# Patient Record
Sex: Female | Born: 1945 | Race: White | Hispanic: No | Marital: Married | Smoking: Never smoker
Health system: Southern US, Community
[De-identification: ages and names within clinical notes are randomized; demographics above are authoritative.]

## PROBLEM LIST (undated history)

## (undated) DIAGNOSIS — T7840XA Allergy, unspecified, initial encounter: Secondary | ICD-10-CM

## (undated) DIAGNOSIS — K219 Gastro-esophageal reflux disease without esophagitis: Secondary | ICD-10-CM

## (undated) DIAGNOSIS — M810 Age-related osteoporosis without current pathological fracture: Secondary | ICD-10-CM

## (undated) HISTORY — DX: Age-related osteoporosis without current pathological fracture: M81.0

## (undated) HISTORY — DX: Gastro-esophageal reflux disease without esophagitis: K21.9

## (undated) HISTORY — PX: CARPAL TUNNEL RELEASE: SHX101

## (undated) HISTORY — DX: Allergy, unspecified, initial encounter: T78.40XA

## (undated) HISTORY — PX: SALPINGOOPHORECTOMY: SHX82

## (undated) HISTORY — PX: BREAST BIOPSY: SHX20

---
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
## 1974-08-08 HISTORY — PX: ABDOMINAL HYSTERECTOMY: SHX81

## 1983-08-09 HISTORY — PX: CHOLECYSTECTOMY: SHX55

## 2013-02-26 DIAGNOSIS — D0511 Intraductal carcinoma in situ of right breast: Secondary | ICD-10-CM

## 2013-02-26 HISTORY — DX: Intraductal carcinoma in situ of right breast: D05.11

## 2015-09-04 DIAGNOSIS — I10 Essential (primary) hypertension: Secondary | ICD-10-CM | POA: Diagnosis not present

## 2015-09-04 DIAGNOSIS — D0511 Intraductal carcinoma in situ of right breast: Secondary | ICD-10-CM | POA: Diagnosis not present

## 2015-09-04 DIAGNOSIS — M810 Age-related osteoporosis without current pathological fracture: Secondary | ICD-10-CM

## 2016-03-03 DIAGNOSIS — D0511 Intraductal carcinoma in situ of right breast: Secondary | ICD-10-CM

## 2017-01-13 DIAGNOSIS — E559 Vitamin D deficiency, unspecified: Secondary | ICD-10-CM | POA: Insufficient documentation

## 2017-01-13 DIAGNOSIS — I10 Essential (primary) hypertension: Secondary | ICD-10-CM

## 2017-01-13 DIAGNOSIS — T148XXA Other injury of unspecified body region, initial encounter: Secondary | ICD-10-CM

## 2017-01-13 DIAGNOSIS — E538 Deficiency of other specified B group vitamins: Secondary | ICD-10-CM | POA: Insufficient documentation

## 2017-01-13 HISTORY — DX: Other injury of unspecified body region, initial encounter: T14.8XXA

## 2017-01-13 HISTORY — DX: Deficiency of other specified B group vitamins: E53.8

## 2017-01-13 HISTORY — DX: Vitamin D deficiency, unspecified: E55.9

## 2017-01-13 HISTORY — DX: Essential (primary) hypertension: I10

## 2017-09-01 DIAGNOSIS — D0511 Intraductal carcinoma in situ of right breast: Secondary | ICD-10-CM

## 2017-12-19 DIAGNOSIS — M5136 Other intervertebral disc degeneration, lumbar region: Secondary | ICD-10-CM | POA: Insufficient documentation

## 2017-12-19 DIAGNOSIS — M51369 Other intervertebral disc degeneration, lumbar region without mention of lumbar back pain or lower extremity pain: Secondary | ICD-10-CM

## 2017-12-19 HISTORY — DX: Other intervertebral disc degeneration, lumbar region without mention of lumbar back pain or lower extremity pain: M51.369

## 2018-03-01 ENCOUNTER — Other Ambulatory Visit: Payer: Self-pay | Admitting: Orthopedic Surgery

## 2018-03-01 DIAGNOSIS — M4712 Other spondylosis with myelopathy, cervical region: Secondary | ICD-10-CM

## 2018-03-04 ENCOUNTER — Ambulatory Visit
Admission: RE | Admit: 2018-03-04 | Discharge: 2018-03-04 | Disposition: A | Discharge status: Home / Self Care | Payer: Medicare Other | Source: Ambulatory Visit | Attending: Orthopedic Surgery | Admitting: Orthopedic Surgery

## 2018-03-04 DIAGNOSIS — M4712 Other spondylosis with myelopathy, cervical region: Secondary | ICD-10-CM

## 2018-03-12 LAB — HM COLONOSCOPY

## 2018-06-20 DIAGNOSIS — M4802 Spinal stenosis, cervical region: Secondary | ICD-10-CM | POA: Insufficient documentation

## 2018-06-20 HISTORY — DX: Spinal stenosis, cervical region: M48.02

## 2019-02-05 ENCOUNTER — Other Ambulatory Visit: Payer: Self-pay | Admitting: Rehabilitation

## 2019-02-05 ENCOUNTER — Ambulatory Visit
Admission: RE | Admit: 2019-02-05 | Discharge: 2019-02-05 | Disposition: A | Discharge status: Home / Self Care | Payer: Medicare Other | Source: Ambulatory Visit | Attending: Rehabilitation | Admitting: Rehabilitation

## 2019-02-05 DIAGNOSIS — M47816 Spondylosis without myelopathy or radiculopathy, lumbar region: Secondary | ICD-10-CM

## 2019-03-28 DIAGNOSIS — Z0000 Encounter for general adult medical examination without abnormal findings: Secondary | ICD-10-CM | POA: Insufficient documentation

## 2019-03-28 HISTORY — DX: Encounter for general adult medical examination without abnormal findings: Z00.00

## 2019-06-17 DIAGNOSIS — Z86000 Personal history of in-situ neoplasm of breast: Secondary | ICD-10-CM | POA: Diagnosis not present

## 2019-06-17 DIAGNOSIS — M810 Age-related osteoporosis without current pathological fracture: Secondary | ICD-10-CM

## 2019-10-11 DIAGNOSIS — J3089 Other allergic rhinitis: Secondary | ICD-10-CM | POA: Insufficient documentation

## 2019-10-11 DIAGNOSIS — M720 Palmar fascial fibromatosis [Dupuytren]: Secondary | ICD-10-CM | POA: Insufficient documentation

## 2019-10-11 HISTORY — DX: Other allergic rhinitis: J30.89

## 2019-10-11 HISTORY — DX: Palmar fascial fibromatosis (dupuytren): M72.0

## 2019-10-21 ENCOUNTER — Other Ambulatory Visit: Payer: Self-pay | Admitting: Orthopedic Surgery

## 2019-10-21 DIAGNOSIS — M4726 Other spondylosis with radiculopathy, lumbar region: Secondary | ICD-10-CM

## 2019-11-13 ENCOUNTER — Ambulatory Visit
Admission: RE | Admit: 2019-11-13 | Discharge: 2019-11-13 | Disposition: A | Discharge status: Home / Self Care | Payer: Medicare Other | Source: Ambulatory Visit | Attending: Orthopedic Surgery | Admitting: Orthopedic Surgery

## 2019-11-13 ENCOUNTER — Other Ambulatory Visit: Payer: Self-pay

## 2019-11-13 DIAGNOSIS — M4726 Other spondylosis with radiculopathy, lumbar region: Secondary | ICD-10-CM

## 2020-05-13 DIAGNOSIS — E782 Mixed hyperlipidemia: Secondary | ICD-10-CM | POA: Insufficient documentation

## 2020-05-13 DIAGNOSIS — R7309 Other abnormal glucose: Secondary | ICD-10-CM

## 2020-05-13 DIAGNOSIS — Z23 Encounter for immunization: Secondary | ICD-10-CM

## 2020-05-13 DIAGNOSIS — M79661 Pain in right lower leg: Secondary | ICD-10-CM

## 2020-05-13 DIAGNOSIS — R5381 Other malaise: Secondary | ICD-10-CM

## 2020-05-13 HISTORY — DX: Other abnormal glucose: R73.09

## 2020-05-13 HISTORY — DX: Pain in right lower leg: M79.661

## 2020-05-13 HISTORY — DX: Other malaise: R53.81

## 2020-05-13 HISTORY — DX: Encounter for immunization: Z23

## 2020-05-13 HISTORY — DX: Mixed hyperlipidemia: E78.2

## 2020-05-28 ENCOUNTER — Other Ambulatory Visit: Payer: Self-pay | Admitting: Orthopedic Surgery

## 2020-05-28 DIAGNOSIS — M4316 Spondylolisthesis, lumbar region: Secondary | ICD-10-CM

## 2020-06-05 ENCOUNTER — Other Ambulatory Visit: Payer: Self-pay | Admitting: Hematology and Oncology

## 2020-06-05 DIAGNOSIS — D0511 Intraductal carcinoma in situ of right breast: Secondary | ICD-10-CM

## 2020-06-16 NOTE — Progress Notes (Signed)
[facility]  [address] [place],  [place]  [address] [phone]  Clinic Day:  06/16/2020  Referring physician: Hadley Pen, MD   This document serves as a record of services personally performed by Gery Pray, MD. It was created on their behalf by [facility] E, a trained medical scribe. The creation of this record is based on the scribe's personal observations and the provider's statements to them.   CHIEF COMPLAINT:  CC: History of stage 0 ductal carcinoma in situ  Current Treatment:  Surveillance   HISTORY OF PRESENT ILLNESS:  Jennifer Waller is a 74 y.o. 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  She was treated with lumpectomy.  Pathology reveals a 2 cm, low to intermediate grade, ductal carcinoma in situ with a negative intramammary node.  Estrogen and progesterone receptors were negative.  She was placed on the NSABP clinical trial B43, but HER 2 Neu testing was negative.  She received adjuvant radiation to the left breast, completed in October 2014.  She was placed on raloxifene for chemoprevention in November 2014.  She has osteoporosis, but did not tolerate alendronate, ibandronate or denosumab.  Bone density scan in September 2018 revealed worsening bone density with a T-score -2.9 in the spine and a T-score of -2.9 in femur, which was decreased from previous by 5.4% and 4.3% respectively.  We therefore recommended she try yearly Reclast for the osteoporosis, which she started last year.  She completed her 5 years of Raloxifene in 2019.  She had her annual mammogram at the end of October 2020 which was clear, and she has followed up with Dr. Georgiana Shore, but requests me to do her follow up going forward.  She also had a bone density scan on October 30th which revealed a T-score of -3.5 of the AP spine L1-L3, previously -3.2, and a T score of -2.8 of the femur neck  (left), previously -2.9.  Both of these scores are considered osteoporotic.  She continues Os-Cal Ultra twice daily.  She was supposed to undergo surgery for degenerative disc disease back in August, but she contracted [facility] Spotted Fever.  This has been rescheduled for January with Dr. Annitta Jersey this had to be canceled due to her severe osteoporosis.  The Reclast was stopped and she was placed Tymlos injections daily for the last year.    INTERVAL HISTORY:  Jennifer Waller is here for annual follow up, and states that she has been on Tymlos injections daily since February 2021 by Dr. Precious Gilding.  She will hopefully be able to pursue back surgery in February 2022 if this therapy is effective.  Therefore, she needs to have a bone density scan to assess her response.  After she completes 18 months, this will be discontinued.  She is scheduled for an MRI of the spine on Sunday.  She is past due for annual mammogram so we will schedule this as well as a bone density scan.  She undergoes routine blood work at Dr. Landry Dyke office.  Her  appetite is good, and she has gained 8 and 1/2 pounds since her last visit.  She denies fever, chills or other signs of infection.  She denies nausea, vomiting, bowel issues, or abdominal pain.  She denies sore throat, cough, dyspnea, or chest pain.   REVIEW OF SYSTEMS:  Review of Systems  Musculoskeletal: Positive for back pain.  All other systems reviewed  and are negative.    VITALS:  There were no vitals taken for this visit.  Wt Readings from Last 3 Encounters:  No data found for Wt    There is no height or weight on file to calculate BMI.  Performance status (ECOG): 1 - Symptomatic but completely ambulatory  PHYSICAL EXAM:  Physical Exam Constitutional:      General: She is not in acute distress.    Appearance: Normal appearance. She is normal weight.  HENT:     Head: Normocephalic and atraumatic.  Eyes:     General: No scleral icterus.    Extraocular  Movements: Extraocular movements intact.     Conjunctiva/sclera: Conjunctivae normal.     Pupils: Pupils are equal, round, and reactive to light.  Cardiovascular:     Rate and Rhythm: Normal rate and regular rhythm.     Pulses: Normal pulses.     Heart sounds: Normal heart sounds. No murmur heard.  No friction rub. No gallop.   Pulmonary:     Effort: Pulmonary effort is normal. No respiratory distress.     Breath sounds: Normal breath sounds.  Chest:     Breasts:        Right: Normal.        Left: Normal.     Comments: Well healed scar in the lateral left breast.  Fibrocystic changes in the inferior inner lower quadrant of the left breast. Abdominal:     General: Bowel sounds are normal. There is no distension.     Palpations: Abdomen is soft. There is no mass.     Tenderness: There is no abdominal tenderness.  Musculoskeletal:        General: Normal range of motion.     Cervical back: Normal range of motion and neck supple.     Right lower leg: No edema.     Left lower leg: No edema.  Lymphadenopathy:     Cervical: No cervical adenopathy.  Skin:    General: Skin is warm and dry.  Neurological:     General: No focal deficit present.     Mental Status: She is alert and oriented to person, place, and time. Mental status is at baseline.  Psychiatric:        Mood and Affect: Mood normal.        Behavior: Behavior normal.        Thought Content: Thought content normal.        Judgment: Judgment normal.     LABS:  No flowsheet data found. No flowsheet data found.   STUDIES:  No results found.   Allergies: Not on File  Current Medications: No current outpatient medications on file.   No current facility-administered medications for this visit.     ASSESSMENT & PLAN:   Assessment:   1. 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  She remains without evidence of recurrence.  She completed 5 years of  chemoprevention with raloxifene in November 2019.  2. Osteoporosis.  She was placed on Tymlos injections daily back in February 2021 by Dr. Precious Gilding.  We will plan to repeat bone density scan to assess the effectiveness of her current therapy so that she can proceed with the planned surgery.  3. 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  She is hoping to pursue surgery in February 2022.  Plan: She was placed on Tymlos injections daily back in February 2021 by Dr. Precious Gilding, and is  planning for back surgery, hopefully in February.  Since she is past due for annual mammogram I will schedule this along with a bone density scan to determine how effective the Tymlos has been and whether she will be able to proceed with surgery safely.  Otherwise, we will plan to see her back in 1 year with bilateral mammogram for reexamination.  The patient understands the plans discussed today and is in agreement with them.  She knows to contact our office if she develops concerns regarding her breast cancer.   I provided 30 minutes of face-to-face time during this this encounter and > 50% was spent counseling as documented under my assessment and plan.    Dellia Beckwith, MD [facility] AT [facility] [address] [place] [address] Dept: [phone] Dept Fax: [phone]   I, Foye Deer, am acting as scribe for Dellia Beckwith, MD  I have reviewed this report as typed by the medical scribe, and it is complete and accurate.

## 2020-06-17 ENCOUNTER — Encounter: Payer: Self-pay | Admitting: Oncology

## 2020-06-17 ENCOUNTER — Inpatient Hospital Stay (INDEPENDENT_AMBULATORY_CARE_PROVIDER_SITE_OTHER): Payer: Medicare Other | Admitting: Oncology

## 2020-06-17 ENCOUNTER — Other Ambulatory Visit: Payer: Self-pay

## 2020-06-17 ENCOUNTER — Inpatient Hospital Stay: Payer: Medicare Other | Attending: Oncology

## 2020-06-17 ENCOUNTER — Other Ambulatory Visit: Payer: Self-pay | Admitting: Oncology

## 2020-06-17 VITALS — BP 151/70 | HR 83 | Temp 97.7°F | Resp 18 | Ht 62.0 in | Wt 158.5 lb

## 2020-06-17 DIAGNOSIS — M810 Age-related osteoporosis without current pathological fracture: Secondary | ICD-10-CM | POA: Insufficient documentation

## 2020-06-17 DIAGNOSIS — Z923 Personal history of irradiation: Secondary | ICD-10-CM | POA: Insufficient documentation

## 2020-06-17 DIAGNOSIS — D0511 Intraductal carcinoma in situ of right breast: Secondary | ICD-10-CM | POA: Diagnosis not present

## 2020-06-17 DIAGNOSIS — Z1231 Encounter for screening mammogram for malignant neoplasm of breast: Secondary | ICD-10-CM

## 2020-06-17 DIAGNOSIS — M549 Dorsalgia, unspecified: Secondary | ICD-10-CM | POA: Diagnosis not present

## 2020-06-17 DIAGNOSIS — Z86000 Personal history of in-situ neoplasm of breast: Secondary | ICD-10-CM | POA: Insufficient documentation

## 2020-06-17 DIAGNOSIS — M5030 Other cervical disc degeneration, unspecified cervical region: Secondary | ICD-10-CM | POA: Insufficient documentation

## 2020-06-17 LAB — CBC AND DIFFERENTIAL
HCT: 47 — AB (ref 36–46)
Hemoglobin: 15.3 (ref 12.0–16.0)
Neutrophils Absolute: 4.68
Platelets: 278 (ref 150–399)
WBC: 7.8

## 2020-06-17 LAB — CMP (CANCER CENTER ONLY)
ALT: 53 U/L — ABNORMAL HIGH (ref 0–44)
AST: 45 U/L — ABNORMAL HIGH (ref 15–41)
Albumin: 4 g/dL (ref 3.5–5.0)
Alkaline Phosphatase: 131 U/L — ABNORMAL HIGH (ref 38–126)
Anion gap: 11 (ref 5–15)
BUN: 20 mg/dL (ref 8–23)
CO2: 26 mmol/L (ref 22–32)
Calcium: 8.6 mg/dL — ABNORMAL LOW (ref 8.9–10.3)
Chloride: 104 mmol/L (ref 98–111)
Creatinine: 0.72 mg/dL (ref 0.44–1.00)
GFR, Estimated: >60 mL/min (ref >60)
Glucose, Bld: 96 mg/dL (ref 70–99)
Potassium: 4.3 mmol/L (ref 3.5–5.1)
Sodium: 141 mmol/L (ref 135–145)
Total Bilirubin: 0.6 mg/dL (ref 0.3–1.2)
Total Protein: 7.8 g/dL (ref 6.5–8.1)

## 2020-06-17 LAB — CBC WITH DIFFERENTIAL (CANCER CENTER ONLY)
Abs Immature Granulocytes: 0.3 10*3/uL — ABNORMAL HIGH (ref 0.00–0.07)
Basophils Absolute: 0.1 10*3/uL (ref 0.0–0.1)
Basophils Relative: 1 %
Eosinophils Absolute: 0.1 10*3/uL (ref 0.0–0.5)
Eosinophils Relative: 1 %
HCT: 48.6 % — ABNORMAL HIGH (ref 36.0–46.0)
Hemoglobin: 15.1 g/dL — ABNORMAL HIGH (ref 12.0–15.0)
Immature Granulocytes: 2 %
Lymphocytes Relative: 22 %
Lymphs Abs: 3.3 10*3/uL (ref 0.7–4.0)
MCH: 28.1 pg (ref 26.0–34.0)
MCHC: 31.1 g/dL (ref 30.0–36.0)
MCV: 90.3 fL (ref 80.0–100.0)
Monocytes Absolute: 1 10*3/uL (ref 0.1–1.0)
Monocytes Relative: 6 %
Neutro Abs: 10.7 10*3/uL — ABNORMAL HIGH (ref 1.7–7.7)
Neutrophils Relative %: 68 %
Platelet Count: 375 10*3/uL (ref 150–400)
RBC: 5.38 MIL/uL — ABNORMAL HIGH (ref 3.87–5.11)
RDW: 15.7 % — ABNORMAL HIGH (ref 11.5–15.5)
WBC Count: 15.5 10*3/uL — ABNORMAL HIGH (ref 4.0–10.5)
nRBC: 0 % (ref 0.0–0.2)

## 2020-06-17 LAB — BASIC METABOLIC PANEL
BUN: 12 (ref 4–21)
CO2: 24 — AB (ref 13–22)
Chloride: 106 (ref 99–108)
Creatinine: 0.5 (ref 0.5–1.1)
Glucose: 113
Potassium: 4 (ref 3.4–5.3)
Sodium: 141 (ref 137–147)

## 2020-06-17 LAB — COMPREHENSIVE METABOLIC PANEL
Albumin: 4.6 (ref 3.5–5.0)
Calcium: 9.4 (ref 8.7–10.7)

## 2020-06-17 LAB — HEPATIC FUNCTION PANEL
ALT: 40 — AB (ref 7–35)
AST: 42 — AB (ref 13–35)
Alkaline Phosphatase: 117 (ref 25–125)
Bilirubin, Total: 0.5

## 2020-06-17 LAB — CBC: RBC: 5.43 — AB (ref 3.87–5.11)

## 2020-06-18 ENCOUNTER — Telehealth: Payer: Self-pay | Admitting: Oncology

## 2020-06-18 NOTE — Telephone Encounter (Signed)
Schedule patient's 11/10 LOS Follow for Nov '11.  Contacted patient of schedule Appt's

## 2020-06-20 ENCOUNTER — Other Ambulatory Visit: Payer: Self-pay | Admitting: Oncology

## 2020-06-20 DIAGNOSIS — D0511 Intraductal carcinoma in situ of right breast: Secondary | ICD-10-CM

## 2020-06-20 DIAGNOSIS — Z1231 Encounter for screening mammogram for malignant neoplasm of breast: Secondary | ICD-10-CM

## 2020-06-20 DIAGNOSIS — M810 Age-related osteoporosis without current pathological fracture: Secondary | ICD-10-CM

## 2020-06-21 ENCOUNTER — Ambulatory Visit
Admission: RE | Admit: 2020-06-21 | Discharge: 2020-06-21 | Disposition: A | Discharge status: Home / Self Care | Payer: Medicare Other | Source: Ambulatory Visit | Attending: Orthopedic Surgery | Admitting: Orthopedic Surgery

## 2020-06-21 DIAGNOSIS — M4316 Spondylolisthesis, lumbar region: Secondary | ICD-10-CM

## 2020-06-25 ENCOUNTER — Telehealth: Payer: Self-pay | Admitting: Oncology

## 2020-06-25 NOTE — Telephone Encounter (Signed)
Per 11/17 Staff Message, Please rescheduled Follow up after 08/05/21 Mammogram (Jan 2023).  Patient Reschedule to Aug 11, 2021 - Notifying patient

## 2020-07-20 ENCOUNTER — Other Ambulatory Visit: Payer: Self-pay | Admitting: Oncology

## 2020-07-20 DIAGNOSIS — D0511 Intraductal carcinoma in situ of right breast: Secondary | ICD-10-CM

## 2020-07-20 DIAGNOSIS — Z1231 Encounter for screening mammogram for malignant neoplasm of breast: Secondary | ICD-10-CM

## 2020-07-21 NOTE — Telephone Encounter (Addendum)
I attempted call to pt to notify her of below, no answer. Unidentified voicemail.   ----- Message from Dellia Beckwith, MD sent at 07/20/2020  7:24 PM EST ----- Regarding: RE: Mammo, bone denisty I have now ordered the mammo for the 3rd time, but separately. It was attached to the DEXA, which I can't get to go through.  Maybe her orthopedic surgeon knows how to get the DEXA approved. ----- Message ----- From: Hipolito Bayley, RN Sent: 07/20/2020  12:53 PM EST To: Dellia Beckwith, MD Subject: Mammo, bone denisty                            Pt called to check on appt's for mammo, & bone density. States she saw you in November, but hasn't heard anything yet.  [phone]

## 2020-07-22 NOTE — Telephone Encounter (Addendum)
I notified pt of below. She states she knew the DEXA may not go through, but Dr Gilman Buttner was going to try. She will reach out to her orthopedic surgeon to see if he can give more information to the insurance company, to approve DEXA scan.  ----- Message from Dellia Beckwith, MD sent at 07/20/2020  7:24 PM EST ----- Regarding: RE: Mammo, bone denisty I have now ordered the mammo for the 3rd time, but separately. It was attached to the DEXA, which I can't get to go through.  Maybe her orthopedic surgeon knows how to get the DEXA approved. ----- Message ----- From: Hipolito Bayley, RN Sent: 07/20/2020  12:53 PM EST To: Dellia Beckwith, MD Subject: Mammo, bone denisty                            Pt called to check on appt's for mammo, & bone density. States she saw you in November, but hasn't heard anything yet.  [phone]

## 2020-08-06 ENCOUNTER — Other Ambulatory Visit: Payer: Self-pay | Admitting: Oncology

## 2020-08-06 ENCOUNTER — Telehealth: Payer: Self-pay

## 2020-08-06 DIAGNOSIS — D0511 Intraductal carcinoma in situ of right breast: Secondary | ICD-10-CM

## 2020-08-06 DIAGNOSIS — Z1231 Encounter for screening mammogram for malignant neoplasm of breast: Secondary | ICD-10-CM

## 2020-08-06 NOTE — Telephone Encounter (Addendum)
Pt notified. Will print reports and fax to PCP and Dr Precious Gilding as requested. Pt very happy to hear the results.    ----- Message from Dellia Beckwith, MD sent at 08/06/2020  8:57 AM EST ----- Regarding: call pt Tell her mammo is clear.  Cc to PCP The DEXA shows very good improvement in just 1 year, going from osteoporosis to osteopenia. Pls send copy to Dr. Precious Gilding so he can proceed with surgery, & he can advise what to do next.

## 2020-08-12 ENCOUNTER — Encounter: Payer: Self-pay | Admitting: Oncology

## 2020-08-19 ENCOUNTER — Encounter: Payer: Self-pay | Admitting: Oncology

## 2020-11-09 DIAGNOSIS — Z9889 Other specified postprocedural states: Secondary | ICD-10-CM

## 2020-11-09 DIAGNOSIS — M4726 Other spondylosis with radiculopathy, lumbar region: Secondary | ICD-10-CM

## 2020-11-09 HISTORY — DX: Other spondylosis with radiculopathy, lumbar region: M47.26

## 2020-11-09 HISTORY — DX: Other specified postprocedural states: Z98.890

## 2020-11-30 DIAGNOSIS — Z09 Encounter for follow-up examination after completed treatment for conditions other than malignant neoplasm: Secondary | ICD-10-CM

## 2020-11-30 HISTORY — DX: Encounter for follow-up examination after completed treatment for conditions other than malignant neoplasm: Z09

## 2020-12-25 DIAGNOSIS — R609 Edema, unspecified: Secondary | ICD-10-CM | POA: Insufficient documentation

## 2020-12-25 DIAGNOSIS — R790 Abnormal level of blood mineral: Secondary | ICD-10-CM

## 2020-12-25 HISTORY — DX: Abnormal level of blood mineral: R79.0

## 2020-12-25 HISTORY — DX: Edema, unspecified: R60.9

## 2021-04-28 DIAGNOSIS — G250 Essential tremor: Secondary | ICD-10-CM | POA: Insufficient documentation

## 2021-04-28 HISTORY — DX: Essential tremor: G25.0

## 2021-05-06 IMAGING — MR MR LUMBAR SPINE W/O CM
4 of 5 series · 23 of 48 positions shown · non-contrast
Comparison: Lumbar MRI 02/05/2019

CLINICAL DATA: Low back pain with bilateral leg pain. History
breast cancer.

EXAM:
MRI LUMBAR SPINE WITHOUT CONTRAST
TECHNIQUE: Multiplanar, multisequence MR imaging of the lumbar spine was
performed. No intravenous contrast was administered.

[Series 5: T2 · sagittal · 4.0mm · 0.73mm/px · 5 of 13 slices shown (1 of 2)]
[im 1/13]
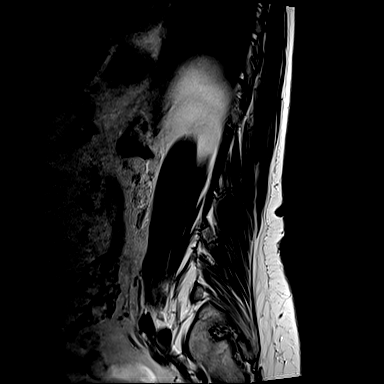
[im 4/13]
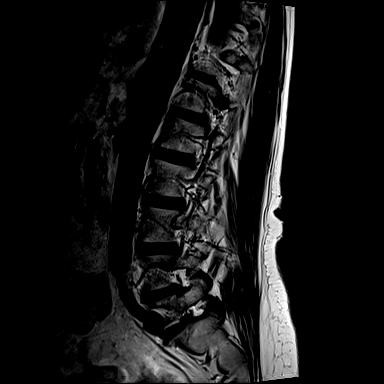
[im 7/13]
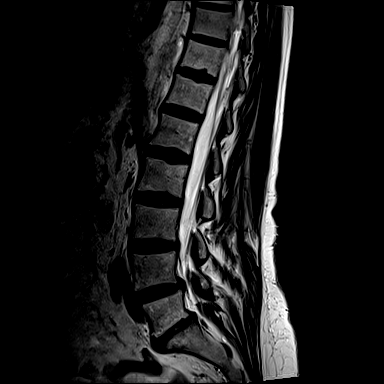
[im 10/13]
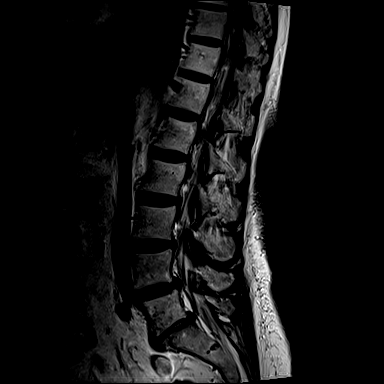
[im 13/13]
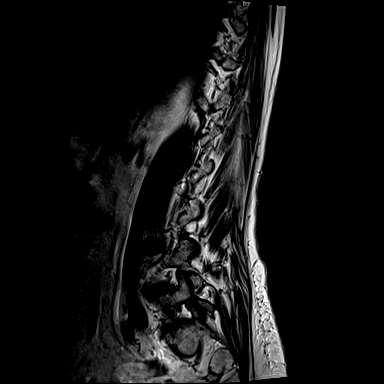

[Series 6: T1 · sagittal · 4.0mm · 0.73mm/px · 5 of 13 slices shown (1 of 2)]
[im 1/13]
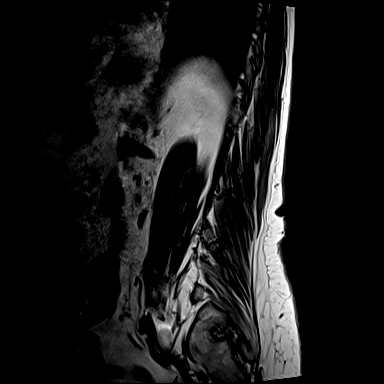
[im 4/13]
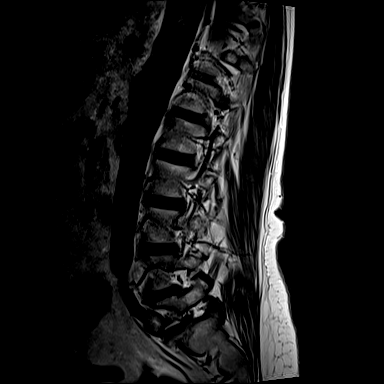
[im 7/13]
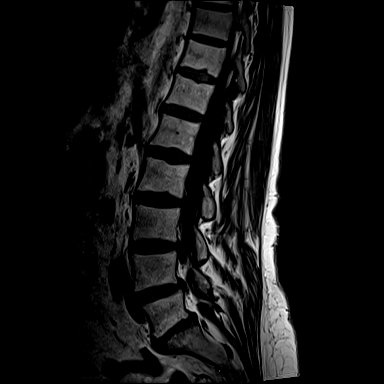
[im 10/13]
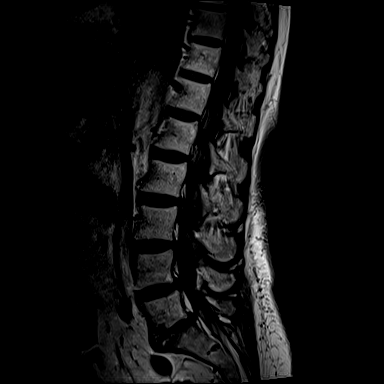
[im 13/13]
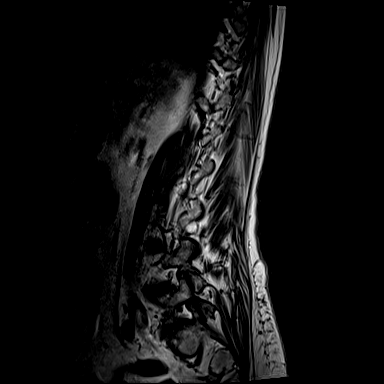

[Series 10: T2 · axial · 4.0mm · 0.35mm/px · z∈[-52,+149]mm · 10 of 36 slices shown (2 of 2)]
[im 3/36]
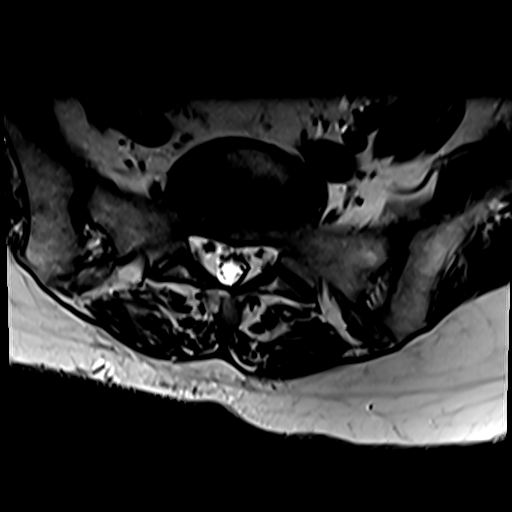
[im 5/36]
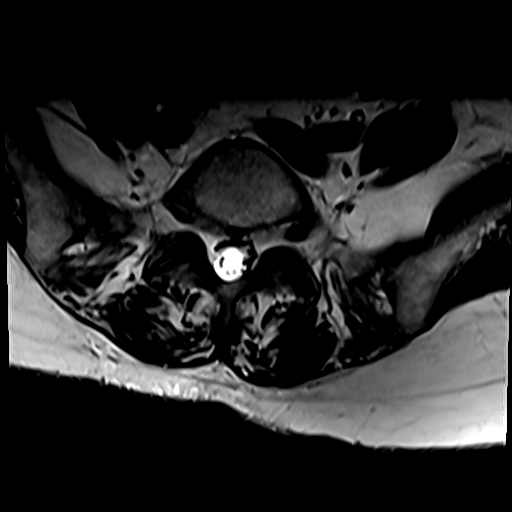
[im 8/36]
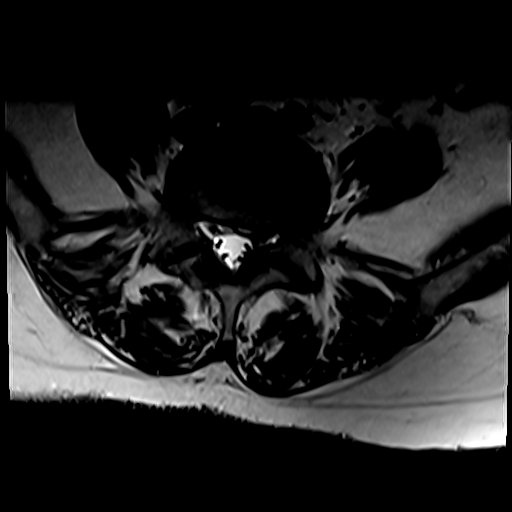
[im 12/36]
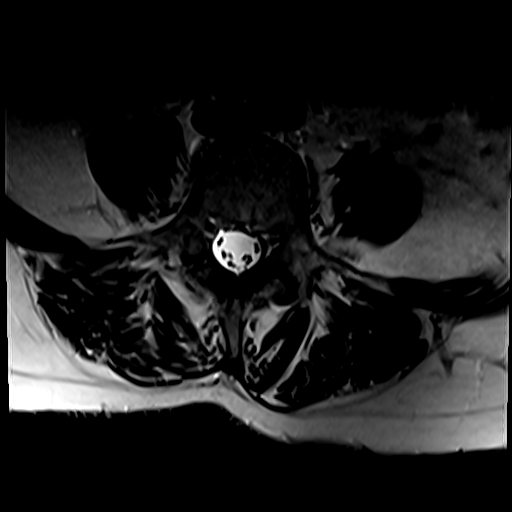
[im 17/36]
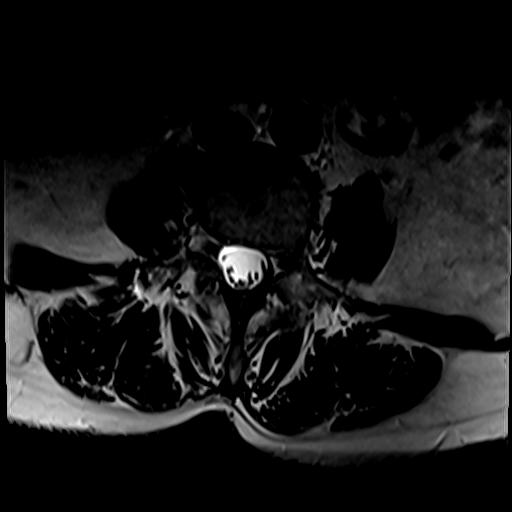
[im 19/36]
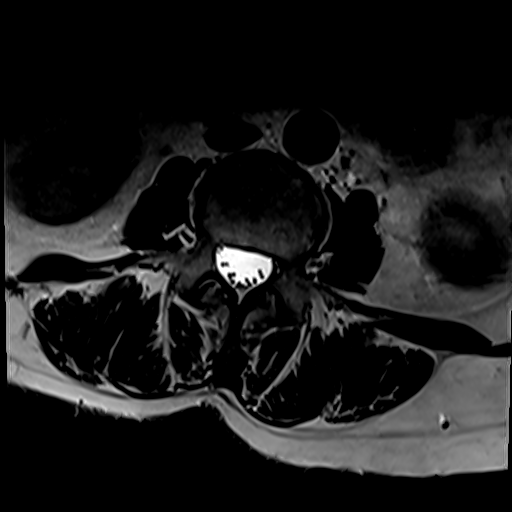
[im 22/36]
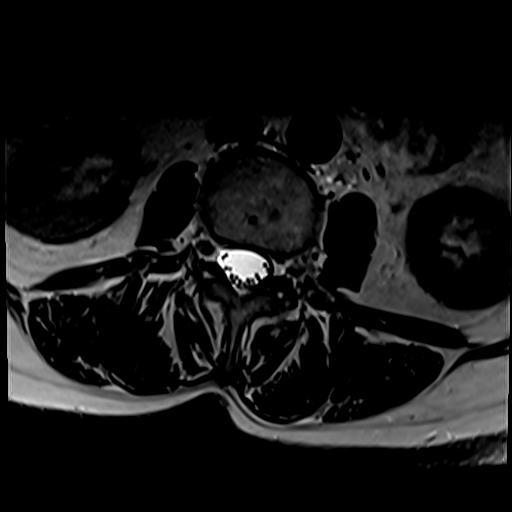
[im 26/36]
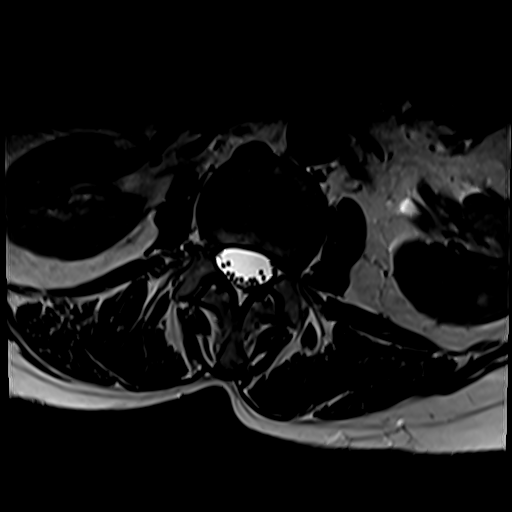
[im 31/36]
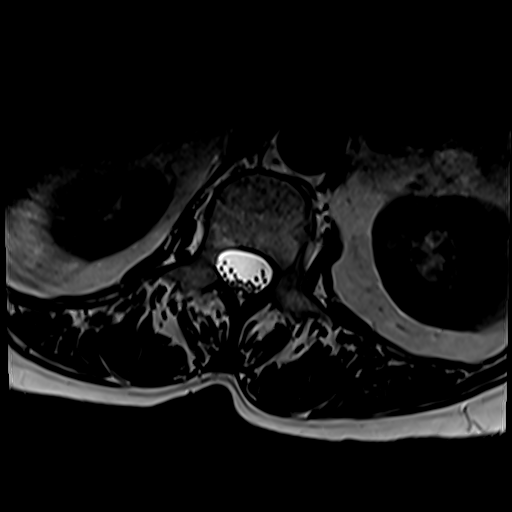
[im 36/36]
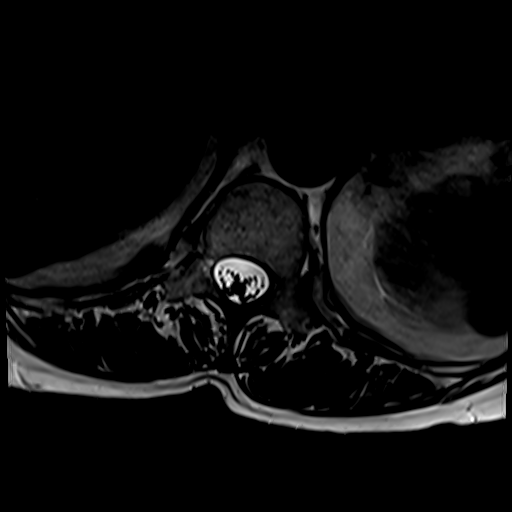

[Series 100: T1 · axial · 4.0mm · 0.35mm/px · z∈[-42,+125]mm · 3 of 36 slices shown (2 of 2)]
[im 5/36]
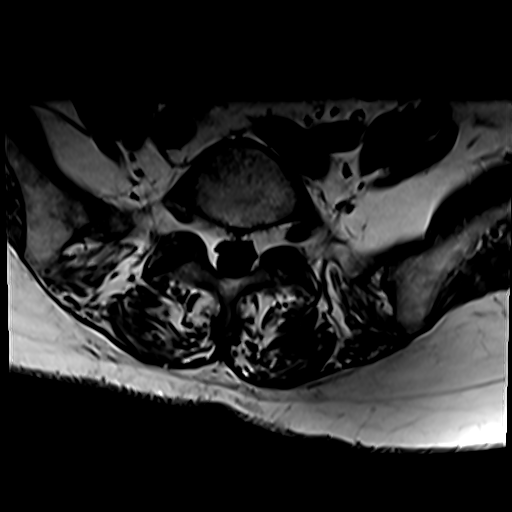
[im 19/36]
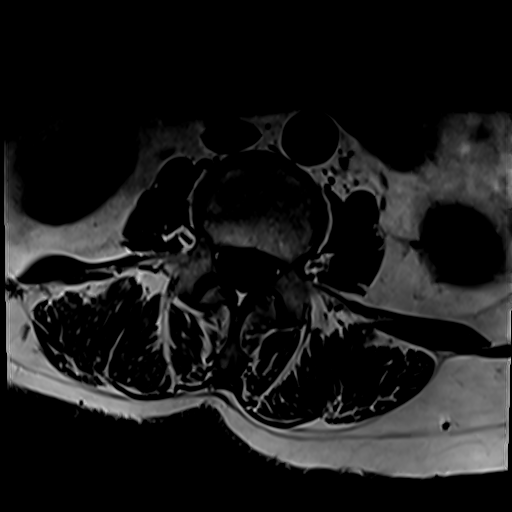
[im 31/36]
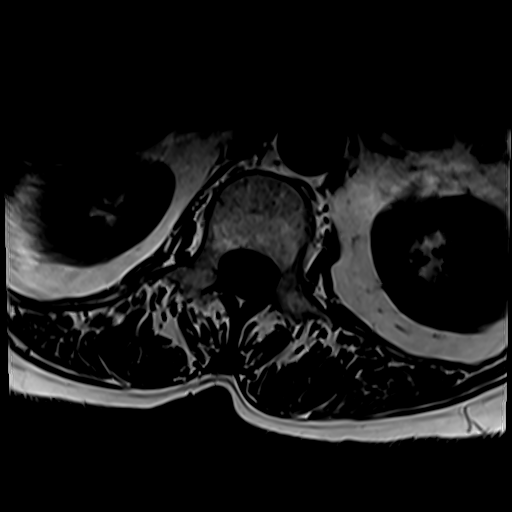

[23 of 48 positions shown; findings below may reference images not displayed]

FINDINGS: Segmentation:  Normal

Alignment:  Slight anterolisthesis L4-5 and L5-S1

Vertebrae: Normal bone marrow. Negative for fracture or mass.
Negative for metastatic disease.

Conus medullaris and cauda equina: Conus extends to the T12-L1
level. Conus and cauda equina appear normal.

Paraspinal and other soft tissues: Negative for paraspinous mass or
adenopathy.

Disc levels:

T12-L1: Negative

L1-2: Negative

L2-3: Negative

L3-4: Mild disc and facet degeneration. Negative for disc protrusion
or stenosis.

L4-5: Mild anterolisthesis with diffuse bulging of the disc and mild
facet degeneration. Moderate subarticular stenosis on the left with
impingement left L5 nerve root in the subarticular zone similar to
the prior study. Mild spinal stenosis. Mild subarticular stenosis on
the right

L5-S1: Disc degeneration with disc bulging and mild endplate
spurring. Mild subarticular stenosis bilaterally. Mild left
foraminal stenosis unchanged.
IMPRESSION: Disc and facet degeneration L4-5 unchanged. Left L5 nerve root
impingement in the subarticular zone appears unchanged.

Disc degeneration and mild subarticular stenosis bilaterally L5-S1
unchanged.

## 2021-06-18 ENCOUNTER — Ambulatory Visit: Payer: Medicare Other | Admitting: Oncology

## 2021-08-05 NOTE — Progress Notes (Incomplete)
[place]  [address] [place],  [place]  [address] [phone]  Clinic Day:  08/05/2021  Referring physician: Myrlene Broker, MD   This document serves as a record of services personally performed by Jennifer Poisson, MD. It was created on their behalf by [facility] E, a trained medical scribe. The creation of this record is based on the scribe's personal observations and the provider's statements to them.  CHIEF COMPLAINT:  CC: History of stage 0 ductal carcinoma in situ  Current Treatment:  Surveillance   HISTORY OF PRESENT ILLNESS:  Jennifer Waller is a 75 y.o. 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  She was treated with lumpectomy.  Pathology reveals a 2 cm, low to intermediate grade, ductal carcinoma in situ with a negative intramammary node.  Estrogen and progesterone receptors were negative.  She was placed on the NSABP clinical trial B43, but HER 2 Neu testing was negative.  She received adjuvant radiation to the left breast, completed in October 2014.  She was placed on raloxifene for chemoprevention in November 2014.  She has osteoporosis, but did not tolerate alendronate, ibandronate or denosumab.  Bone density scan in September 2018 revealed worsening bone density with a T-score -2.9 in the spine and a T-score of -2.9 in femur, which was decreased from previous by 5.4% and 4.3% respectively.  We therefore recommended she try yearly Reclast for the osteoporosis, which she started last year.  She completed her 5 years of Raloxifene in 2019.  She had her annual mammogram at the end of October 2020 which was clear, and she has followed up with Dr. Noberto Retort, but requests me to do her follow up going forward.  She also had a bone density scan on October 30th which revealed a T-score of -3.5 of the AP spine L1-L3, previously -3.2, and a T score of -2.8 of the femur neck  (left), previously -2.9.  Both of these scores are considered osteoporotic.  She continues Os-Cal Ultra twice daily.  She was supposed to undergo surgery for degenerative disc disease back in August, but she contracted [facility] Spotted Fever.  This has been rescheduled for January with Dr. Delaney Meigs this had to be canceled due to her severe osteoporosis.  The Reclast was stopped and she was placed Tymlos injections daily for the last year.    INTERVAL HISTORY:  Jennifer Waller is here for annual follow up, and states that she has been on Tymlos injections daily since February 2021 by Dr. Sherlyn Lick.  She will hopefully be able to pursue back surgery in February 2022 if this therapy is effective.  Therefore, she needs to have a bone density scan to assess her response.  After she completes 18 months, this will be discontinued.  She is scheduled for an MRI of the spine on Sunday.  She is past due for annual mammogram so we will schedule this as well as a bone density scan.  She undergoes routine blood work at Dr. Tor Netters office.  Her  appetite is good, and she has gained 8 and 1/2 pounds since her last visit.  She denies fever, chills or other signs of infection.  She denies nausea, vomiting, bowel issues, or abdominal pain.  She denies sore throat, cough, dyspnea, or chest pain.  Jennifer Waller is here for annual follow up ***. She has not had annual mammogram this year, but evaluation from December  2021 was clear. Bone density from December 2021 was significantly improved, now considered osteopenic with a T-score of -2.4 of the left femur, previously -2.8. Dual femur total mean measures -1.8, previously -2.4. Left forearm measures -2.4, previously -2.8. AP spine measured -1.9, improved from -3.5.   Her  appetite is good, and she has gained/lost _ pounds since her last visit.  She denies fever, chills or other signs of infection.  She denies nausea, vomiting, bowel issues, or abdominal pain.  She denies sore throat,  cough, dyspnea, or chest pain.  REVIEW OF SYSTEMS:  Review of Systems  Constitutional: Negative.  Negative for appetite change, chills, fatigue, fever and unexpected weight change.  HENT:  Negative.    Eyes: Negative.   Respiratory: Negative.  Negative for chest tightness, cough, hemoptysis, shortness of breath and wheezing.   Cardiovascular: Negative.  Negative for chest pain, leg swelling and palpitations.  Gastrointestinal: Negative.  Negative for abdominal distention, abdominal pain, blood in stool, constipation, diarrhea, nausea and vomiting.  Endocrine: Negative.   Genitourinary: Negative.  Negative for difficulty urinating, dysuria, frequency and hematuria.   Musculoskeletal: Negative.  Negative for arthralgias, back pain, flank pain, gait problem and myalgias.  Skin: Negative.   Neurological: Negative.  Negative for dizziness, extremity weakness, gait problem, headaches, light-headedness, numbness, seizures and speech difficulty.  Hematological: Negative.   Psychiatric/Behavioral: Negative.  Negative for depression and sleep disturbance. The patient is not nervous/anxious.     VITALS:  There were no vitals taken for this visit.  Wt Readings from Last 3 Encounters:  06/17/20 158 lb 8 oz (71.9 kg)    There is no height or weight on file to calculate BMI.  Performance status (ECOG): 1 - Symptomatic but completely ambulatory  PHYSICAL EXAM:  Physical Exam Constitutional:      General: She is not in acute distress.    Appearance: Normal appearance. She is normal weight.  HENT:     Head: Normocephalic and atraumatic.  Eyes:     General: No scleral icterus.    Extraocular Movements: Extraocular movements intact.     Conjunctiva/sclera: Conjunctivae normal.     Pupils: Pupils are equal, round, and reactive to light.  Cardiovascular:     Rate and Rhythm: Normal rate and regular rhythm.     Pulses: Normal pulses.     Heart sounds: Normal heart sounds. No murmur heard.   No  friction rub. No gallop.  Pulmonary:     Effort: Pulmonary effort is normal. No respiratory distress.     Breath sounds: Normal breath sounds.  Abdominal:     General: Bowel sounds are normal. There is no distension.     Palpations: Abdomen is soft. There is no hepatomegaly, splenomegaly or mass.     Tenderness: There is no abdominal tenderness.  Musculoskeletal:        General: Normal range of motion.     Cervical back: Normal range of motion and neck supple.     Right lower leg: No edema.     Left lower leg: No edema.  Lymphadenopathy:     Cervical: No cervical adenopathy.  Skin:    General: Skin is warm and dry.  Neurological:     General: No focal deficit present.     Mental Status: She is alert and oriented to person, place, and time. Mental status is at baseline.  Psychiatric:        Mood and Affect: Mood normal.        Behavior:  Behavior normal.        Thought Content: Thought content normal.        Judgment: Judgment normal.    LABS:   CBC Latest Ref Rng & Units 06/17/2020 06/17/2020  WBC 4.0 - 10.5 K/uL 15.5(H) 7.8  Hemoglobin 12.0 - 15.0 g/dL 15.1(H) 15.3  Hematocrit 36.0 - 46.0 % 48.6(H) 47(A)  Platelets 150 - 400 K/uL 375 278   CMP Latest Ref Rng & Units 06/17/2020 06/17/2020  Glucose 70 - 99 mg/dL 96 -  BUN 8 - 23 mg/dL 20 12  Creatinine 7.74 - 1.00 mg/dL 1.28 0.5  Sodium 786 - 145 mmol/L 141 141  Potassium 3.5 - 5.1 mmol/L 4.3 4.0  Chloride 98 - 111 mmol/L 104 106  CO2 22 - 32 mmol/L 26 24(A)  Calcium 8.9 - 10.3 mg/dL 7.6(H) 9.4  Total Protein 6.5 - 8.1 g/dL 7.8 -  Total Bilirubin 0.3 - 1.2 mg/dL 0.6 -  Alkaline Phos 38 - 126 U/L 131(H) 117  AST 15 - 41 U/L 45(H) 42(A)  ALT 0 - 44 U/L 53(H) 40(A)     STUDIES:  No results found.   Allergies:  Allergies  Allergen Reactions   Doxycycline Nausea And Vomiting    Current Medications: Current Outpatient Medications  Medication Sig Dispense Refill   Abaloparatide (TYMLOS Walters) Inject 1 Dose into the  skin daily.     albuterol (VENTOLIN HFA) 108 (90 Base) MCG/ACT inhaler Inhale into the lungs.     amLODipine (NORVASC) 5 MG tablet Take 5 mg by mouth daily.     calcium carbonate (OSCAL) 1500 (600 Ca) MG TABS tablet Take 2 tablets by mouth every evening.     cefdinir (OMNICEF) 300 MG capsule Take 300 mg by mouth 2 (two) times daily.     Cholecalciferol 50 MCG (2000 UT) CAPS Take 1 tablet by mouth every evening.     fluticasone (FLONASE) 50 MCG/ACT nasal spray Place 1 spray into both nostrils 2 (two) times daily as needed.     Fluticasone-Salmeterol (ADVAIR DISKUS) 250-50 MCG/DOSE AEPB INHALE 1 DOSE BY MOUTH EVERY 12 HOURS     gabapentin (NEURONTIN) 300 MG capsule SMARTSIG:1 Capsule(s) By Mouth 4-5 Times Daily     Multiple Vitamins-Minerals (CENTRUM SILVER PO) Take 1 tablet by mouth every evening.     Omega-3 1000 MG CAPS Take by mouth.     omeprazole (PRILOSEC) 40 MG capsule Take 1 capsule by mouth daily.     pravastatin (PRAVACHOL) 20 MG tablet Take 1 tablet by mouth daily.     No current facility-administered medications for this visit.     ASSESSMENT & PLAN:   Assessment:   1. 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  She remains without evidence of recurrence.  She completed 5 years of chemoprevention with raloxifene in November 2019.  2. Osteopenia, previously osteoporosis.  She was placed on Tymlos injections daily back in February 2021 by Dr. Precious Gilding.  Repeat bone density in December 2021 revealed significant improvement.   3. 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  She is hoping to pursue surgery in February 2022.  Plan: Since she is past due for annual mammogram I will schedule this.  Otherwise, we will plan to see her back in 1 year with bilateral mammogram for reexamination.  The patient understands the plans discussed today and is in agreement with them.  She knows to contact our office if she develops  concerns regarding her breast cancer.   I provided 30 minutes of face-to-face time during this this encounter and > 50% was spent counseling as documented under my assessment and plan.    Derwood Kaplan, MD [facility] AT [facility] [address] [place] [address] Dept: [phone] Dept Fax: [phone]   I, Rita Ohara, am acting as scribe for Derwood Kaplan, MD  I have reviewed this report as typed by the medical scribe, and it is complete and accurate.

## 2021-08-10 ENCOUNTER — Telehealth: Payer: Self-pay | Admitting: Oncology

## 2021-08-10 NOTE — Telephone Encounter (Signed)
Patient rescheduled 1/4 Follow Up to 1/30 due to being sick and also Mammo is scheduled for 1/26

## 2021-08-11 ENCOUNTER — Ambulatory Visit: Payer: Medicare Other | Admitting: Oncology

## 2021-09-01 ENCOUNTER — Encounter: Payer: Self-pay | Admitting: Oncology

## 2021-09-03 ENCOUNTER — Other Ambulatory Visit: Payer: Self-pay

## 2021-09-06 ENCOUNTER — Ambulatory Visit: Payer: Medicare Other | Admitting: Oncology

## 2021-09-07 ENCOUNTER — Ambulatory Visit: Payer: Medicare Other | Admitting: Hematology and Oncology

## 2021-09-14 ENCOUNTER — Other Ambulatory Visit: Payer: Self-pay

## 2021-09-14 NOTE — Assessment & Plan Note (Deleted)
History of ductal carcinoma in situ of the breast diagnosed in July 2014.  She was treated with lumpectomy followed by adjuvant radiation therapy.  She completed 5 years of chemoprevention with raloxifene in November 2019. She remains without evidence of recurrence.  We will plan to see her back in 1 year with a bilateral screening mammogram.

## 2021-09-14 NOTE — Progress Notes (Deleted)
Jennifer Waller  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  She was treated with lumpectomy followed by adjuvant radiation therapy.  She completed 5 years of chemoprevention with raloxifene in November 2019. She remains without evidence of recurrence.  We will plan to see her back in 1 year with a bilateral screening mammogram.    The patient understands the plans discussed today and is in agreement with them.  She knows to contact our office if she develops concerns prior to her next appointment.   I provided *** minutes of face-to-face time during this encounter and > 50% was spent counseling as documented under my assessment and plan.    Marvia Pickles, PA-C  [facility] AT [facility] [address] [place] [address] Dept: [phone] Dept Fax: [phone]   No orders of the defined types were placed in this encounter.     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  She was treated with lumpectomy.  Pathology reveals a 2 cm, low to intermediate grade, ductal carcinoma in situ with a negative intramammary node.  Estrogen and progesterone receptors were negative.  She was placed on the NSABP clinical trial B43, but HER 2 Neu testing was negative.  She received adjuvant radiation to the left breast, completed in October 2014.  She was placed on raloxifene for chemoprevention in November 2014.  She has osteoporosis,  but did not tolerate alendronate, ibandronate or denosumab.  Bone density scan in September 2018 revealed worsening bone density with a T-score -2.9 in the spine and a T-score of -2.9 in femur, which was decreased from previous by 5.4% and 4.3% respectively.  We therefore recommended she try yearly Reclast for the osteoporosis, which she started last year. She completed her 5 years of Raloxifene in 2019.   Annual mammograms have remained without evidence of malignancy.  Bone density scan in October 2020 revealed osteoporosis with a T-score of -3.5 of the AP spine L1-L3, previously -3.2, and a T score of -2.8 of the femur neck (left), previously -2.9.  Despite Reclast yearly in addition to calcium supplementation twice daily.  She was supposed to undergo surgery for degenerative disc disease, but this had to be canceled due to her severe osteoporosis.  She has been on Tymlos injections daily since February 2021.  Repeat bone density scan in December 2021 revealed improvement in her bone density, now only osteopenia, with a T score -1.9 in the spine and a T score of -2.4 in the left femur neck.  INTERVAL HISTORY:  Jennifer Waller is here today for annual examination.  She denies any changes in her breasts.  She denies fevers or chills. She denies pain. Her appetite is good. Her weight {Weight change:10426}.  Prior to her visit today she had a bilateral screening mammogram.  She is scheduled for bone density scan again in March.  REVIEW OF SYSTEMS:  Review of Systems  Constitutional:  Negative  for appetite change, chills, fatigue, fever and unexpected weight change.  HENT:   Negative for lump/mass, mouth sores and sore throat.   Respiratory:  Negative for cough and shortness of breath.   Cardiovascular:  Negative for chest pain and leg swelling.  Gastrointestinal:  Negative for abdominal pain, constipation, diarrhea, nausea and vomiting.  Endocrine: Negative for hot flashes.  Genitourinary:  Negative for  difficulty urinating, dysuria, frequency and hematuria.   Musculoskeletal:  Negative for arthralgias, back pain and myalgias.  Skin:  Negative for rash.  Neurological:  Negative for dizziness and headaches.  Hematological:  Negative for adenopathy. Does not bruise/bleed easily.  Psychiatric/Behavioral:  Negative for depression and sleep disturbance. The patient is not nervous/anxious.     VITALS:  There were no vitals taken for this visit.  Wt Readings from Last 3 Encounters:  06/17/20 158 lb 8 oz (71.9 kg)    There is no height or weight on file to calculate BMI.  Performance status (ECOG): {CHL ONC [id number]  PHYSICAL EXAM:  Physical Exam Vitals and nursing note reviewed.  Constitutional:      General: She is not in acute distress.    Appearance: Normal appearance.  HENT:     Head: Normocephalic and atraumatic.     Mouth/Throat:     Mouth: Mucous membranes are moist.     Pharynx: Oropharynx is clear. No oropharyngeal exudate or posterior oropharyngeal erythema.  Eyes:     General: No scleral icterus.    Extraocular Movements: Extraocular movements intact.     Conjunctiva/sclera: Conjunctivae normal.     Pupils: Pupils are equal, round, and reactive to light.  Cardiovascular:     Rate and Rhythm: Normal rate and regular rhythm.     Heart sounds: Normal heart sounds. No murmur heard.   No friction rub. No gallop.  Pulmonary:     Effort: Pulmonary effort is normal.     Breath sounds: Normal breath sounds. No wheezing, rhonchi or rales.  Chest:  Breasts:    Right: Normal. No swelling, bleeding, inverted nipple, mass, nipple discharge, skin change or tenderness.     Left: Normal. No swelling, bleeding, inverted nipple, mass, nipple discharge, skin change or tenderness.  Abdominal:     General: There is no distension.     Palpations: Abdomen is soft. There is no hepatomegaly, splenomegaly or mass.     Tenderness: There is no abdominal tenderness.  Musculoskeletal:         General: Normal range of motion.     Cervical back: Normal range of motion and neck supple. No tenderness.     Right lower leg: No edema.     Left lower leg: No edema.  Lymphadenopathy:     Cervical: No cervical adenopathy.     Upper Body:     Right upper body: No supraclavicular or axillary adenopathy.     Left upper body: No supraclavicular or axillary adenopathy.     Lower Body: No right inguinal adenopathy. No left inguinal adenopathy.  Skin:    General: Skin is warm and dry.     Coloration: Skin is not jaundiced.     Findings: No rash.  Neurological:     Mental Status: She is alert and oriented to person, place, and time.     Cranial Nerves: No cranial nerve deficit.  Psychiatric:        Mood and Affect: Mood normal.        Behavior: Behavior normal.  Thought Content: Thought content normal.    LABS:   CBC Latest Ref Rng & Units 06/17/2020 06/17/2020  WBC 4.0 - 10.5 K/uL 15.5(H) 7.8  Hemoglobin 12.0 - 15.0 g/dL 15.1(H) 15.3  Hematocrit 36.0 - 46.0 % 48.6(H) 47(A)  Platelets 150 - 400 K/uL 375 278   CMP Latest Ref Rng & Units 06/17/2020 06/17/2020  Glucose 70 - 99 mg/dL 96 -  BUN 8 - 23 mg/dL 20 12  Creatinine 0.44 - 1.00 mg/dL 0.72 0.5  Sodium 135 - 145 mmol/L 141 141  Potassium 3.5 - 5.1 mmol/L 4.3 4.0  Chloride 98 - 111 mmol/L 104 106  CO2 22 - 32 mmol/L 26 24(A)  Calcium 8.9 - 10.3 mg/dL 8.6(L) 9.4  Total Protein 6.5 - 8.1 g/dL 7.8 -  Total Bilirubin 0.3 - 1.2 mg/dL 0.6 -  Alkaline Phos 38 - 126 U/L 131(H) 117  AST 15 - 41 U/L 45(H) 42(A)  ALT 0 - 44 U/L 53(H) 40(A)     No results found for: CEA1 / No results found for: CEA1 No results found for: PSA1 No results found for: [id number] No results found for: [id number]  No results found for: TOTALPROTELP, ALBUMINELP, A1GS, A2GS, BETS, BETA2SER, GAMS, MSPIKE, SPEI No results found for: TIBC, FERRITIN, IRONPCTSAT No results found for: LDH  STUDIES:  No results found.    Exam(s): [id number] MAM/MAM  DIGITAL TOMO SCREENING B  CLINICAL DATA: Screening.  EXAM:  DIGITAL SCREENING BILATERAL MAMMOGRAM WITH TOMOSYNTHESIS AND CAD  TECHNIQUE:  Bilateral screening digital craniocaudal and mediolateral oblique  mammograms were obtained. Bilateral screening digital breast  tomosynthesis was performed. The images were evaluated with  computer-aided detection.  COMPARISON: Previous exam(s).  ACR Breast Density Category c: The breast tissue is heterogeneously  dense, which may obscure small masses.  FINDINGS:  There are no findings suspicious for malignancy.  IMPRESSION:  No mammographic evidence of malignancy. A result letter of this  screening mammogram will be mailed directly to the patient.  RECOMMENDATION:  Screening mammogram in one year. (Code:SM-B-01Y)  BI-RADS CATEGORY 1: Negative.   HISTORY:   Past Medical History:  Diagnosis Date   Allergy    GERD (gastroesophageal reflux disease)    Osteoporosis     Past Surgical History:  Procedure Laterality Date   ABDOMINAL HYSTERECTOMY  1976   due to fibroids   APPENDECTOMY  1963   BREAST BIOPSY     CARPAL TUNNEL RELEASE Right    CHOLECYSTECTOMY  1985   SALPINGOOPHORECTOMY Bilateral     Family History  Problem Relation Age of Onset   Lung cancer Sister    Skin cancer Maternal Grandfather    Cancer Paternal Grandmother        neck    Social History:  reports that she has never smoked. She has never used smokeless tobacco. She reports that she does not drink alcohol and does not use drugs.The patient is {Blank single:19197::"alone","accompanied by"} *** today.  Allergies:  Allergies  Allergen Reactions   Doxycycline Nausea And Vomiting   Ibandronic Acid Nausea Only    Current Medications: Current Outpatient Medications  Medication Sig Dispense Refill   Abaloparatide (TYMLOS [place]) Inject 1 Dose into the skin daily.     albuterol (VENTOLIN HFA) 108 (90 Base) MCG/ACT inhaler Inhale into the lungs.     amLODipine (NORVASC)  5 MG tablet Take 5 mg by mouth daily.     amoxicillin (AMOXIL) 500 MG capsule Take 500 mg by mouth 3 (three) times daily.  atenolol (TENORMIN) 25 MG tablet Take 25 mg by mouth daily.     calcium carbonate (OSCAL) 1500 (600 Ca) MG TABS tablet Take 2 tablets by mouth every evening.     Cholecalciferol 50 MCG (2000 UT) CAPS Take 1 tablet by mouth every evening.     fluticasone (FLONASE) 50 MCG/ACT nasal spray Place 1 spray into both nostrils 2 (two) times daily as needed.     Fluticasone-Salmeterol (ADVAIR DISKUS) 250-50 MCG/DOSE AEPB INHALE 1 DOSE BY MOUTH EVERY 12 HOURS     gabapentin (NEURONTIN) 300 MG capsule SMARTSIG:1 Capsule(s) By Mouth 4-5 Times Daily     LAGEVRIO 200 MG CAPS capsule Take 4 capsules by mouth 2 (two) times daily.     LORazepam (ATIVAN) 1 MG tablet Take 1 mg by mouth once as needed.     losartan-hydrochlorothiazide (HYZAAR) 50-12.5 MG tablet Take 1 tablet by mouth daily.     Magnesium Oxide 400 MG CAPS Take by mouth.     Multiple Vitamins-Minerals (CENTRUM SILVER PO) Take 1 tablet by mouth every evening.     Omega-3 1000 MG CAPS Take by mouth.     omeprazole (PRILOSEC) 40 MG capsule Take 1 capsule by mouth daily.     pravastatin (PRAVACHOL) 20 MG tablet Take 1 tablet by mouth daily.     predniSONE (DELTASONE) 20 MG tablet Take 20 mg by mouth 2 (two) times daily.     primidone (MYSOLINE) 50 MG tablet Take by mouth.     promethazine-dextromethorphan (PROMETHAZINE-DM) 6.25-15 MG/5ML syrup Take 5 mLs by mouth every 4 (four) hours as needed.     zaleplon (SONATA) 5 MG capsule Take by mouth.     No current facility-administered medications for this visit.

## 2021-09-15 ENCOUNTER — Ambulatory Visit: Payer: Medicare Other | Admitting: Hematology and Oncology

## 2021-09-15 DIAGNOSIS — D0511 Intraductal carcinoma in situ of right breast: Secondary | ICD-10-CM

## 2021-09-30 ENCOUNTER — Other Ambulatory Visit: Payer: Self-pay

## 2021-10-04 NOTE — Progress Notes (Signed)
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  She remains without evidence of recurrence.  We will plan to see her back in 1 year with a bilateral screening mammogram.   The patient understands the plans discussed today and is in agreement with them.  She knows to contact our office if she develops concerns prior to her next appointment.      Marvia Pickles, PA-C  [facility] AT [facility] [address] [place] [address] Dept: [phone] Dept Fax: [phone]   Orders Placed This Encounter  Procedures   MM DIGITAL SCREENING BILATERAL    Standing Status:   Future    Standing Expiration Date:   10/05/2022    Scheduling Instructions:     RH    Order Specific Question:   Reason for exam:    Answer:   screening for breast cancer, history of right breast cancer    Order Specific Question:   Preferred imaging location?    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  She was treated with lumpectomy.  Pathology reveals a 2 cm, low to intermediate grade, ductal carcinoma in situ with a negative intramammary node.  Estrogen and progesterone receptors were negative.  She was placed on the NSABP clinical trial B43, but HER 2 Neu testing was negative.  She received adjuvant radiation to the left breast, completed in October 2014.  She was placed on raloxifene for chemoprevention in November 2014.  She has  osteoporosis, but did not tolerate alendronate, ibandronate or denosumab.  Bone density scan in September 2018 revealed worsening bone density with a T-score -2.9 in the spine and a T-score of -2.9 in femur, which was decreased from previous by 5.4% and 4.3% respectively.  She was placed on yearly Reclast for the osteoporosis, along with calcium and vitamin D twice daily. She completed her 5 years of raloxifene in 2019.  Annual mammograms have remained without evidence of malignancy.   Bone density scan in October 2020 revealed worsening osteoporosis and spine with a T-score of -3.5 of the AP spine L1-L3, previously -3.2, and a T score of -2.8 of the femur neck (left), previously -2.9.  She was supposed to undergo surgery for degenerative disc disease, but this had to be canceled due to her severe osteoporosis.  Reclast was discontinued and she was placed Tymlos injections daily by Dr. Sherlyn Lick.  She finally underwent surgery in April 2022.  INTERVAL HISTORY:  Jennifer Waller is here today for repeat clinical assessment and states she has been doing fairly well.  She denies any changes in her breasts.  She has tremor of her head and is on primidone for that with stabilization.  She denies fevers or chills. She denies pain. Her appetite is good. Her weight has increased 8 pounds over last year .  She underwent bilateral screening mammogram in January which did not reveal any  evidence of malignancy.  She is scheduled for repeat bone density scan in March.  REVIEW OF SYSTEMS:  Review of Systems  Constitutional:  Negative for appetite change, chills, fatigue, fever and unexpected weight change.  HENT:   Negative for lump/mass, mouth sores and sore throat.   Respiratory:  Negative for cough and shortness of breath.   Cardiovascular:  Negative for chest pain and leg swelling.  Gastrointestinal:  Negative for abdominal pain, constipation, diarrhea, nausea and vomiting.  Endocrine: Negative for hot flashes.   Genitourinary:  Negative for difficulty urinating, dysuria, frequency and hematuria.   Musculoskeletal:  Negative for arthralgias, back pain and myalgias.  Skin:  Negative for rash.  Neurological:  Negative for dizziness and headaches.  Hematological:  Negative for adenopathy. Does not bruise/bleed easily.  Psychiatric/Behavioral:  Negative for depression and sleep disturbance. The patient is not nervous/anxious.     VITALS:  Blood pressure 132/86, pulse 96, temperature 98.4 F (36.9 C), temperature source Oral, resp. rate 18, height 5' (1.524 m), weight 166 lb 12.8 oz (75.7 kg), SpO2 99 %.  Wt Readings from Last 3 Encounters:  10/05/21 166 lb 12.8 oz (75.7 kg)  06/17/20 158 lb 8 oz (71.9 kg)    Body mass index is 32.58 kg/m.  Performance status (ECOG): 1 - Symptomatic but completely ambulatory  PHYSICAL EXAM:  Physical Exam Vitals and nursing note reviewed.  Constitutional:      General: She is not in acute distress.    Appearance: Normal appearance.  HENT:     Head: Normocephalic and atraumatic.     Mouth/Throat:     Mouth: Mucous membranes are moist.     Pharynx: Oropharynx is clear. No oropharyngeal exudate or posterior oropharyngeal erythema.  Eyes:     General: No scleral icterus.    Extraocular Movements: Extraocular movements intact.     Conjunctiva/sclera: Conjunctivae normal.     Pupils: Pupils are equal, round, and reactive to light.  Cardiovascular:     Rate and Rhythm: Normal rate and regular rhythm.     Heart sounds: Normal heart sounds. No murmur heard.   No friction rub. No gallop.  Pulmonary:     Effort: Pulmonary effort is normal.     Breath sounds: Normal breath sounds. No wheezing, rhonchi or rales.  Chest:  Breasts:    Right: Normal. No swelling, bleeding, inverted nipple, mass, nipple discharge, skin change or tenderness.     Left: Normal. No swelling, bleeding, inverted nipple, mass, nipple discharge, skin change or tenderness.  Abdominal:      General: There is no distension.     Palpations: Abdomen is soft. There is no hepatomegaly, splenomegaly or mass.     Tenderness: There is no abdominal tenderness.  Musculoskeletal:        General: Normal range of motion.     Cervical back: Normal range of motion and neck supple. No tenderness.     Right lower leg: No edema.     Left lower leg: No edema.  Lymphadenopathy:     Cervical: No cervical adenopathy.     Upper Body:     Right upper body: No supraclavicular or axillary adenopathy.     Left upper body: No supraclavicular or axillary adenopathy.     Lower Body: No right inguinal adenopathy. No left inguinal adenopathy.  Skin:    General: Skin is warm and dry.     Coloration: Skin is not jaundiced.     Findings: No rash.  Neurological:  Mental Status: She is alert and oriented to person, place, and time.     Cranial Nerves: No cranial nerve deficit.  Psychiatric:        Mood and Affect: Mood normal.        Behavior: Behavior normal.        Thought Content: Thought content normal.    LABS:   CBC Latest Ref Rng & Units 06/17/2020 06/17/2020  WBC 4.0 - 10.5 K/uL 15.5(H) 7.8  Hemoglobin 12.0 - 15.0 g/dL 15.1(H) 15.3  Hematocrit 36.0 - 46.0 % 48.6(H) 47(A)  Platelets 150 - 400 K/uL 375 278   CMP Latest Ref Rng & Units 06/17/2020 06/17/2020  Glucose 70 - 99 mg/dL 96 -  BUN 8 - 23 mg/dL 20 12  Creatinine 0.44 - 1.00 mg/dL 0.72 0.5  Sodium 135 - 145 mmol/L 141 141  Potassium 3.5 - 5.1 mmol/L 4.3 4.0  Chloride 98 - 111 mmol/L 104 106  CO2 22 - 32 mmol/L 26 24(A)  Calcium 8.9 - 10.3 mg/dL 8.6(L) 9.4  Total Protein 6.5 - 8.1 g/dL 7.8 -  Total Bilirubin 0.3 - 1.2 mg/dL 0.6 -  Alkaline Phos 38 - 126 U/L 131(H) 117  AST 15 - 41 U/L 45(H) 42(A)  ALT 0 - 44 U/L 53(H) 40(A)     No results found for: CEA1 / No results found for: CEA1 No results found for: PSA1 No results found for: [id number] No results found for: [id number]  No results found for: TOTALPROTELP, ALBUMINELP, A1GS,  A2GS, BETS, BETA2SER, GAMS, MSPIKE, SPEI No results found for: TIBC, FERRITIN, IRONPCTSAT No results found for: LDH  STUDIES:  No results found.    Exam(s): [id number] MAM/MAM DIGITAL TOMO SCREENING B  CLINICAL DATA: Screening.  EXAM:  DIGITAL SCREENING BILATERAL MAMMOGRAM WITH TOMOSYNTHESIS AND CAD  TECHNIQUE:  Bilateral screening digital craniocaudal and mediolateral oblique  mammograms were obtained. Bilateral screening digital breast  tomosynthesis was performed. The images were evaluated with  computer-aided detection.  COMPARISON: Previous exam(s).  ACR Breast Density Category c: The breast tissue is heterogeneously  dense, which may obscure small masses.  FINDINGS:  There are no findings suspicious for malignancy.  IMPRESSION:  No mammographic evidence of malignancy. A result letter of this  screening mammogram will be mailed directly to the patient.  RECOMMENDATION:  Screening mammogram in one year. (Code:SM-B-01Y)  BI-RADS CATEGORY 1: Negative.   HISTORY:   Past Medical History:  Diagnosis Date   Allergy    GERD (gastroesophageal reflux disease)    Osteoporosis     Past Surgical History:  Procedure Laterality Date   ABDOMINAL HYSTERECTOMY  1976   due to fibroids   APPENDECTOMY  1963   BREAST BIOPSY     CARPAL TUNNEL RELEASE Right    CHOLECYSTECTOMY  1985   SALPINGOOPHORECTOMY Bilateral     Family History  Problem Relation Age of Onset   Lung cancer Sister    Skin cancer Maternal Grandfather    Cancer Paternal Grandmother        neck    Social History:  reports that she has never smoked. She has never used smokeless tobacco. She reports that she does not drink alcohol and does not use drugs.The patient is alone today.  Allergies:  Allergies  Allergen Reactions   Doxycycline Nausea And Vomiting   Ibandronic Acid Nausea Only    Current Medications: Current Outpatient Medications  Medication Sig Dispense Refill   albuterol (VENTOLIN HFA) 108 (90  Base) MCG/ACT inhaler Inhale into  the lungs.     amLODipine (NORVASC) 5 MG tablet Take 5 mg by mouth daily.     atenolol (TENORMIN) 25 MG tablet Take 25 mg by mouth daily.     calcium carbonate (OSCAL) 1500 (600 Ca) MG TABS tablet Take 2 tablets by mouth every evening.     Cholecalciferol 50 MCG (2000 UT) CAPS Take 1 tablet by mouth every evening.     fluticasone (FLONASE) 50 MCG/ACT nasal spray Place 1 spray into both nostrils 2 (two) times daily as needed.     Fluticasone-Salmeterol (ADVAIR DISKUS) 250-50 MCG/DOSE AEPB INHALE 1 DOSE BY MOUTH EVERY 12 HOURS     gabapentin (NEURONTIN) 300 MG capsule SMARTSIG:1 Capsule(s) By Mouth 4-5 Times Daily     losartan-hydrochlorothiazide (HYZAAR) 50-12.5 MG tablet Take 1 tablet by mouth daily.     Magnesium Oxide 400 MG CAPS Take by mouth.     Multiple Vitamins-Minerals (CENTRUM SILVER PO) Take 1 tablet by mouth every evening.     Omega-3 1000 MG CAPS Take by mouth.     omeprazole (PRILOSEC) 40 MG capsule Take 1 capsule by mouth daily.     pravastatin (PRAVACHOL) 20 MG tablet Take 1 tablet by mouth daily.     primidone (MYSOLINE) 50 MG tablet Take by mouth.     promethazine-dextromethorphan (PROMETHAZINE-DM) 6.25-15 MG/5ML syrup Take 5 mLs by mouth every 4 (four) hours as needed.     No current facility-administered medications for this visit.

## 2021-10-04 NOTE — Assessment & Plan Note (Addendum)
History of ductal carcinoma in situ of the right breast.  She remains without evidence of recurrence.  We will plan to see her back in 1 year with a bilateral screening mammogram.

## 2021-10-05 ENCOUNTER — Other Ambulatory Visit: Payer: Self-pay

## 2021-10-05 ENCOUNTER — Encounter: Payer: Self-pay | Admitting: Hematology and Oncology

## 2021-10-05 ENCOUNTER — Inpatient Hospital Stay: Payer: Medicare Other | Attending: Hematology and Oncology | Admitting: Hematology and Oncology

## 2021-10-05 VITALS — BP 132/86 | HR 96 | Temp 98.4°F | Resp 18 | Ht 60.0 in | Wt 166.8 lb

## 2021-10-05 DIAGNOSIS — Z1231 Encounter for screening mammogram for malignant neoplasm of breast: Secondary | ICD-10-CM | POA: Diagnosis not present

## 2021-10-05 DIAGNOSIS — D0511 Intraductal carcinoma in situ of right breast: Secondary | ICD-10-CM

## 2022-02-11 DIAGNOSIS — J22 Unspecified acute lower respiratory infection: Secondary | ICD-10-CM

## 2022-02-11 HISTORY — DX: Unspecified acute lower respiratory infection: J22

## 2022-08-11 ENCOUNTER — Ambulatory Visit: Payer: Medicare Other | Admitting: Oncology

## 2022-10-03 NOTE — Progress Notes (Incomplete)
[facility]  [address] [place],  [place]  [address] [phone]   Clinic Day:  06/16/2020   Referring physician: Myrlene Broker, MD  ASSESSMENT & PLAN:    Assessment:   1. 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  She remains without evidence of recurrence.  She completed 5 years of chemoprevention with raloxifene in November 2019.   2. Osteoporosis.  She was placed on Tymlos injections daily back in February 2021 by Dr. Sherlyn Lick.  We will plan to repeat bone density scan to assess the effectiveness of her current therapy so that she can proceed with the planned surgery.   3. 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  She is hoping to pursue surgery in February 2022.   Plan: She was placed on Tymlos injections daily back in February 2021 by Dr. Sherlyn Lick, and is planning for back surgery, hopefully in February.  Since she is past due for annual mammogram I will schedule this along with a bone density scan to determine how effective the Tymlos has been and whether she will be able to proceed with surgery safely.  Otherwise, we will plan to see her back in 1 year with bilateral mammogram for reexamination.  The patient understands the plans discussed today and is in agreement with them.  She knows to contact our office if she develops concerns regarding her breast cancer.     I provided 30 minutes of face-to-face time during this this encounter and > 50% was spent counseling as documented under my assessment and plan.      Derwood Kaplan, MD [facility] [address] [place] [address] Dept: [phone] Dept Fax: [phone]   CHIEF COMPLAINT:  CC: History of stage 0 ductal carcinoma in situ   Current Treatment:  Surveillance     HISTORY OF PRESENT ILLNESS:  Jennifer Waller is a 77  y.o. 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  She was treated with lumpectomy.  Pathology reveals a 2 cm, low to intermediate grade, ductal carcinoma in situ with a negative intramammary node.  Estrogen and progesterone receptors were negative.  She was placed on the NSABP clinical trial B43, but HER 2 Neu testing was negative.  She received adjuvant radiation to the left breast, completed in October 2014.  She was placed on raloxifene for chemoprevention in November 2014.  She has osteoporosis, but did not tolerate alendronate, ibandronate or denosumab.  Bone density scan in September 2018 revealed worsening bone density with a T-score -2.9 in the spine and a T-score of -2.9 in femur, which was decreased from previous by 5.4% and 4.3% respectively.  We therefore recommended she try yearly Reclast for the osteoporosis, which she started last year.  She completed her 5 years of Raloxifene in 2019.   She had her annual mammogram at the end of October 2020 which was clear, and she has followed up with Dr. Noberto Retort, but requests me to do her follow up going forward.  She also had a bone density scan on October 30th which revealed a T-score of -3.5 of the AP spine L1-L3, previously -3.2, and a T score of -2.8 of the femur neck (left), previously -2.9.  Both of these scores are considered osteoporotic.  She continues Os-Cal Ultra  twice daily.  She was supposed to undergo surgery for degenerative disc disease back in August, but she contracted [facility] Spotted Fever.  This has been rescheduled for January with Dr. Delaney Meigs this had to be canceled due to her severe osteoporosis.  The Reclast was stopped and she was placed Tymlos injections daily for the last year.    INTERVAL HISTORY:  Jennifer Waller is here for annual follow up,      and states that she has been on Tymlos injections daily since February 2021 by Dr. Sherlyn Lick.  S  he will hopefully  be able to pursue back surgery in February 2022 if this therapy is effective.  Therefore, she needs to have a bone density scan to assess her response.  After she completes 18 months, this will be discontinued.    She is scheduled for an MRI of the spine on Sunday.  She is past due for annual mammogram so we will schedule this as well as a bone density scan.    She undergoes routine blood work at Dr. Tor Netters office.    Her  appetite is good, and she has gained 8 and 1/2 pounds since her last visit.  She denies fever, chills or other signs of infection.  She denies nausea, vomiting, bowel issues, or abdominal pain.  She denies sore throat, cough, dyspnea, or chest pain.   REVIEW OF SYSTEMS:  Review of Systems  Constitutional: Negative.  Negative for appetite change, chills, diaphoresis, fatigue, fever and unexpected weight change.  HENT:  Negative.  Negative for hearing loss, lump/mass, mouth sores, nosebleeds, sore throat, tinnitus, trouble swallowing and voice change.   Eyes: Negative.  Negative for eye problems and icterus.  Respiratory: Negative.  Negative for chest tightness, cough, hemoptysis, shortness of breath and wheezing.   Cardiovascular: Negative.  Negative for chest pain, leg swelling and palpitations.  Gastrointestinal: Negative.  Negative for abdominal distention, abdominal pain, blood in stool, constipation, diarrhea, nausea, rectal pain and vomiting.  Endocrine: Negative.   Genitourinary: Negative.  Negative for bladder incontinence, difficulty urinating, dyspareunia, dysuria, frequency, hematuria, menstrual problem, nocturia, pelvic pain, vaginal bleeding and vaginal discharge.   Musculoskeletal: Negative.  Negative for arthralgias, back pain, flank pain, gait problem, myalgias, neck pain and neck stiffness.  Skin: Negative.  Negative for itching, rash and wound.  Neurological:  Negative for dizziness, extremity weakness, gait problem, headaches, light-headedness, numbness,  seizures and speech difficulty.  Hematological: Negative.  Negative for adenopathy. Does not bruise/bleed easily.  Psychiatric/Behavioral: Negative.  Negative for confusion, decreased concentration, depression, sleep disturbance and suicidal ideas. The patient is not nervous/anxious.    VITALS:     PHYSICAL EXAM:  Physical Exam Vitals and nursing note reviewed.  Constitutional:      General: She is not in acute distress.    Appearance: Normal appearance. She is normal weight. She is not ill-appearing, toxic-appearing or diaphoretic.  HENT:     Head: Normocephalic and atraumatic.     Right Ear: Tympanic membrane, ear canal and external ear normal. There is no impacted cerumen.     Left Ear: Tympanic membrane, ear canal and external ear normal. There is no impacted cerumen.     Nose: Nose normal. No congestion or rhinorrhea.     Mouth/Throat:     Mouth: Mucous membranes are moist.     Pharynx: No oropharyngeal exudate or posterior oropharyngeal erythema.  Eyes:     General: No scleral icterus.       Right eye: No discharge.  Left eye: No discharge.     Extraocular Movements: Extraocular movements intact.     Conjunctiva/sclera: Conjunctivae normal.     Pupils: Pupils are equal, round, and reactive to light.  Neck:     Vascular: No carotid bruit.  Cardiovascular:     Rate and Rhythm: Normal rate and regular rhythm.     Pulses: Normal pulses.     Heart sounds: Normal heart sounds. No murmur heard.    No friction rub. No gallop.  Pulmonary:     Effort: Pulmonary effort is normal. No respiratory distress.     Breath sounds: Normal breath sounds. No stridor. No wheezing, rhonchi or rales.  Chest:     Chest wall: No tenderness.  Abdominal:     General: Bowel sounds are normal. There is no distension.     Palpations: Abdomen is soft. There is no hepatomegaly, splenomegaly or mass.     Tenderness: There is no abdominal tenderness. There is no right CVA tenderness, left CVA  tenderness, guarding or rebound.     Hernia: No hernia is present.  Musculoskeletal:        General: No swelling, tenderness, deformity or signs of injury. Normal range of motion.     Cervical back: Normal range of motion and neck supple. No rigidity or tenderness.     Right lower leg: No edema.     Left lower leg: No edema.  Lymphadenopathy:     Cervical: No cervical adenopathy.     Right cervical: No superficial, deep or posterior cervical adenopathy.    Left cervical: No superficial, deep or posterior cervical adenopathy.     Upper Body:     Right upper body: No supraclavicular, axillary or pectoral adenopathy.     Left upper body: No supraclavicular, axillary or pectoral adenopathy.  Skin:    General: Skin is warm and dry.     Coloration: Skin is not jaundiced or pale.     Findings: No bruising, erythema, lesion or rash.  Neurological:     General: No focal deficit present.     Mental Status: She is alert and oriented to person, place, and time. Mental status is at baseline.     Cranial Nerves: No cranial nerve deficit.     Sensory: No sensory deficit.     Motor: No weakness.     Coordination: Coordination normal.     Gait: Gait normal.     Deep Tendon Reflexes: Reflexes normal.  Psychiatric:        Mood and Affect: Mood normal.        Behavior: Behavior normal.        Thought Content: Thought content normal.        Judgment: Judgment normal.    HISTORY:   Allergies  Allergen Reactions   Doxycycline Nausea And Vomiting   Ibandronic Acid Nausea Only   Past Medical History:  Diagnosis Date   Allergy    GERD (gastroesophageal reflux disease)    Osteoporosis    Past Surgical History:  Procedure Laterality Date   ABDOMINAL HYSTERECTOMY  1976   due to fibroids   APPENDECTOMY  1963   BREAST BIOPSY     CARPAL TUNNEL RELEASE Right    CHOLECYSTECTOMY  1985   SALPINGOOPHORECTOMY Bilateral    Family History  Problem Relation Age of Onset   Lung cancer Sister     Skin cancer Maternal Grandfather    Cancer Paternal Grandmother        neck  Social History   Tobacco Use  Smoking Status Never  Smokeless Tobacco Never   Social History   Substance and Sexual Activity  Alcohol Use Never   Social History   Substance and Sexual Activity  Drug Use Never   LABS:   CMP  05/26/2022  Sodium 135 - 146 MMOL/L 137  Potassium 3.5 - 5.3 MMOL/L 3.8  Comment: NO VISIBLE HEMOLYSIS  Chloride 98 - 110 MMOL/L 101  CO2 21 - 31 MMOL/L 23  BUN 8 - 24 MG/DL 11  Glucose 70 - 99 MG/DL 128 High   Creatinine 0.60 - 1.20 MG/DL 0.73  Calcium 8.5 - 10.5 MG/DL 8.6  Total Protein 6.4 - 8.9 G/DL 7.1  Albumin 3.5 - 5.7 G/DL 4.1  Total Bilirubin 0.0 - 1.0 MG/DL 0.6  Alkaline Phosphatase 34 - 104 IU/L or U/L 117 High   AST (SGOT) 13 - 39 IU/L or U/L 72 High   ALT (SGPT) 7 - 52 IU/L or U/L 58 High     Ref Range & Units 4 mo ago 05/26/2022  HEMOGLOBIN A1C <5.8 % 6.4 High   eAG MG/DL 137    Ref Range & Units 4 mo ago 05/26/2022  LDL Direct <100 mg/dL 103 High   Total Cholesterol <200 MG/DL 158  Triglycerides <150 MG/DL 183 High   HDL Cholesterol >=60 MG/DL 40 Low   Total Chol / HDL Cholesterol <4.5 4.0  Non-HDL Cholesterol MG/DL 118    Ref Range & Units 4 mo ago 05/26/2022  FREE T4 0.6 - 1.3 NG/DL 1.0    Ref Range & Units 05/26/2022  TSH 0.45 - 5.00 UIU/ML 3.62    Ref Range & Units 4 mo ago 05/26/2022  GGT 9 - 64 IU/L or U/L 93 High    STUDIES:      I,Jasmine M Lassiter,acting as a scribe for Derwood Kaplan, MD.,have documented all relevant documentation on the behalf of Derwood Kaplan, MD,as directed by  Derwood Kaplan, MD while in the presence of Derwood Kaplan, MD.

## 2022-10-05 ENCOUNTER — Inpatient Hospital Stay: Payer: Medicare Other

## 2022-10-05 ENCOUNTER — Other Ambulatory Visit: Payer: Self-pay | Admitting: Oncology

## 2022-10-05 ENCOUNTER — Inpatient Hospital Stay: Payer: Medicare Other | Admitting: Oncology

## 2022-10-05 ENCOUNTER — Telehealth: Payer: Self-pay

## 2022-10-05 DIAGNOSIS — D0511 Intraductal carcinoma in situ of right breast: Secondary | ICD-10-CM

## 2022-10-05 NOTE — Telephone Encounter (Signed)
Do not see were mammo was done in Jan 2024. DEXA scan printed from 2023

## 2022-10-05 NOTE — Telephone Encounter (Signed)
-----   Message from Derwood Kaplan, MD sent at 10/05/2022  7:13 AM EST ----- Regarding: mammo She was due for mammo end of Jan.  Also pls print last DEXA

## 2022-10-06 NOTE — Progress Notes (Signed)
[name],  [place]  [address] [phone]  Clinic Day:  10/06/2022  Referring physician: Myrlene Broker, MD    CHIEF COMPLAINT:  CC: History of stage 0 ductal carcinoma in situ  Current Treatment:  Surveillance   HISTORY OF PRESENT ILLNESS:  Jennifer Waller is a 77 y.o. 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  She was treated with lumpectomy.  Pathology reveals a 2 cm, low to intermediate grade, ductal carcinoma in situ with a negative intramammary node.  Estrogen and progesterone receptors were negative.  She was placed on the NSABP clinical trial B43, but HER 2 Neu testing was negative.  She received adjuvant radiation to the left breast, completed in October 2014.  She was placed on raloxifene for chemoprevention in November 2014.  She has osteoporosis, but did not tolerate alendronate, ibandronate or denosumab.  Bone density scan in September 2018 revealed worsening bone density with a T-score -2.9 in the spine and a T-score of -2.9 in femur, which was decreased from previous by 5.4% and 4.3% respectively.  We therefore recommended she try yearly Reclast for the osteoporosis, which she started last year.  She completed her 5 years of Raloxifene in 2019.  She had her annual mammogram at the end of October 2020 which was clear, and she has followed up with Dr. Noberto Retort, but requests me to do her follow up going forward.  She also had a bone density scan on October 30th which revealed a T-score of -3.5 of the AP spine L1-L3, previously -3.2, and a T score of -2.8 of the femur neck (left), previously -2.9.  Both of these scores are considered osteoporotic.  She continues Os-Cal Ultra twice daily.  She was supposed to undergo surgery for degenerative disc disease back in August, but she contracted [facility] Spotted Fever.  This has been rescheduled for  January with Dr. Delaney Meigs this had to be canceled due to her severe osteoporosis.  The Reclast was stopped and she was placed Tymlos injections daily for the last year.    INTERVAL HISTORY:  [name] is here for annual follow up, and states that she has been.             She will hopefully be able to pursue back surgery in February 2022 if this therapy is effective.  Therefore, she needs to have a bone density scan to assess her response.  After she completes 18 months, this will be discontinued.    She is scheduled for an MRI of the spine on Sunday.    She is past due for annual mammogram so we will schedule this as well as a bone density scan.    She undergoes routine blood work at Dr. Tor Netters office.    Her  appetite is good, and she has gained 8 and 1/2 pounds since her last visit.  She denies fever, chills or other signs of infection.  She denies nausea, vomiting, bowel issues, or abdominal pain.  She denies sore throat, cough, dyspnea, or chest pain.   REVIEW OF SYSTEMS:  Review of Systems  Musculoskeletal:  Positive for back pain.  All other systems reviewed and are negative.    VITALS:  There were no vitals taken for this visit.  Wt Readings from Last 3 Encounters:  10/05/21 166 lb 12.8 oz (75.7 kg)  06/17/20 158 lb 8  oz (71.9 kg)    There is no height or weight on file to calculate BMI.  Performance status (ECOG): 1 - Symptomatic but completely ambulatory  PHYSICAL EXAM:  Physical Exam Constitutional:      General: She is not in acute distress.    Appearance: Normal appearance. She is normal weight.  HENT:     Head: Normocephalic and atraumatic.  Eyes:     General: No scleral icterus.    Extraocular Movements: Extraocular movements intact.     Conjunctiva/sclera: Conjunctivae normal.     Pupils: Pupils are equal, round, and reactive to light.  Cardiovascular:     Rate and Rhythm: Normal rate and regular rhythm.     Pulses: Normal pulses.     Heart  sounds: Normal heart sounds. No murmur heard.    No friction rub. No gallop.  Pulmonary:     Effort: Pulmonary effort is normal. No respiratory distress.     Breath sounds: Normal breath sounds.  Chest:  Breasts:    Right: Normal.     Left: Normal.     Comments: Well healed scar in the lateral left breast.  Fibrocystic changes in the inferior inner lower quadrant of the left breast. Abdominal:     General: Bowel sounds are normal. There is no distension.     Palpations: Abdomen is soft. There is no mass.     Tenderness: There is no abdominal tenderness.  Musculoskeletal:        General: Normal range of motion.     Cervical back: Normal range of motion and neck supple.     Right lower leg: No edema.     Left lower leg: No edema.  Lymphadenopathy:     Cervical: No cervical adenopathy.  Skin:    General: Skin is warm and dry.  Neurological:     General: No focal deficit present.     Mental Status: She is alert and oriented to person, place, and time. Mental status is at baseline.  Psychiatric:        Mood and Affect: Mood normal.        Behavior: Behavior normal.        Thought Content: Thought content normal.        Judgment: Judgment normal.     LABS:      Latest Ref Rng & Units 06/17/2020    1:17 PM 06/17/2020   12:00 AM  CBC  WBC 4.0 - 10.5 K/uL 15.5  7.8      Hemoglobin 12.0 - 15.0 g/dL 15.1  15.3      Hematocrit 36.0 - 46.0 % 48.6  47      Platelets 150 - 400 K/uL 375  278         This result is from an external source.      Latest Ref Rng & Units 06/17/2020    1:17 PM 06/17/2020   12:00 AM  CMP  Glucose 70 - 99 mg/dL 96    BUN 8 - 23 mg/dL 20  12      Creatinine 0.44 - 1.00 mg/dL 0.72  0.5      Sodium 135 - 145 mmol/L 141  141      Potassium 3.5 - 5.1 mmol/L 4.3  4.0      Chloride 98 - 111 mmol/L 104  106      CO2 22 - 32 mmol/L 26  24      Calcium 8.9 - 10.3 mg/dL 8.6  9.4  Total Protein 6.5 - 8.1 g/dL 7.8    Total Bilirubin 0.3 - 1.2 mg/dL 0.6     Alkaline Phos 38 - 126 U/L 131  117      AST 15 - 41 U/L 45  42      ALT 0 - 44 U/L 53  40         This result is from an external source.     STUDIES:  No results found.   Allergies:  Allergies  Allergen Reactions   Doxycycline Nausea And Vomiting   Ibandronic Acid Nausea Only    Current Medications: Current Outpatient Medications  Medication Sig Dispense Refill   albuterol (VENTOLIN HFA) 108 (90 Base) MCG/ACT inhaler Inhale into the lungs.     amLODipine (NORVASC) 5 MG tablet Take 5 mg by mouth daily.     atenolol (TENORMIN) 25 MG tablet Take 25 mg by mouth daily.     calcium carbonate (OSCAL) 1500 (600 Ca) MG TABS tablet Take 2 tablets by mouth every evening.     Cholecalciferol 50 MCG (2000 UT) CAPS Take 1 tablet by mouth every evening.     fluticasone (FLONASE) 50 MCG/ACT nasal spray Place 1 spray into both nostrils 2 (two) times daily as needed.     Fluticasone-Salmeterol (ADVAIR DISKUS) 250-50 MCG/DOSE AEPB INHALE 1 DOSE BY MOUTH EVERY 12 HOURS     gabapentin (NEURONTIN) 300 MG capsule SMARTSIG:1 Capsule(s) By Mouth 4-5 Times Daily     losartan-hydrochlorothiazide (HYZAAR) 50-12.5 MG tablet Take 1 tablet by mouth daily.     Magnesium Oxide 400 MG CAPS Take by mouth.     Multiple Vitamins-Minerals (CENTRUM SILVER PO) Take 1 tablet by mouth every evening.     Omega-3 1000 MG CAPS Take by mouth.     omeprazole (PRILOSEC) 40 MG capsule Take 1 capsule by mouth daily.     pravastatin (PRAVACHOL) 20 MG tablet Take 1 tablet by mouth daily.     primidone (MYSOLINE) 50 MG tablet Take by mouth.     promethazine-dextromethorphan (PROMETHAZINE-DM) 6.25-15 MG/5ML syrup Take 5 mLs by mouth every 4 (four) hours as needed.     No current facility-administered medications for this visit.     ASSESSMENT & PLAN:   Assessment:   1. 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  She remains without evidence of recurrence.  She  completed 5 years of chemoprevention with raloxifene in November 2019.  2. Osteoporosis.  She was placed on Tymlos injections daily back in February 2021 by Dr. Sherlyn Lick.  We will plan to repeat bone density scan to assess the effectiveness of her current therapy so that she can proceed with the planned surgery.  3. 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  She is hoping to pursue surgery in February 2022.  Plan: She was placed on Tymlos injections daily back in February 2021 by Dr. Sherlyn Lick, and is planning for back surgery, hopefully in February.  Since she is past due for annual mammogram I will schedule this along with a bone density scan to determine how effective the Tymlos has been and whether she will be able to proceed with surgery safely.  Otherwise, we will plan to see her back in 1 year with bilateral mammogram for reexamination.  The patient understands the plans discussed today and is in agreement with them.  She knows to contact our office if she develops concerns regarding her breast cancer.  I provided 30 minutes of face-to-face time during this this encounter and > 50% was spent counseling as documented under my assessment and plan.    Derwood Kaplan, MD [facility] [address] [place] [address] Dept: [phone] Dept Fax: [phone]

## 2022-10-07 NOTE — Progress Notes (Deleted)
[facility]  [address] [place],  [place]  [id number] [phone]   Clinic Day:  10/11/22    Referring physician: Myrlene Broker, MD    ASSESSMENT & PLAN:    Assessment:   1. 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  She remains without evidence of recurrence.  She completed 5 years of chemoprevention with raloxifene in November 2019.   2. Osteoporosis/Osteopenia.  She was placed on Tymlos injections daily back in February 2021 by Dr. Sherlyn Lick.  Repeat bone density scan is stable to slightly improved in March, 2023.    3. 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 She has now had spine surgery last year, 2023.    Plan: She is past due for her annual mammogram and I will schedule that. Her labs today are pending and I will add magnesium and TSH levels. I will see her back in a little over a year with CBC, CMP, bone density and screening bilateral mammogram. The patient understands the plans discussed today and is in agreement with them.  She knows to contact our office if she develops concerns regarding her breast cancer.     I provided 20 minutes of face-to-face time during this this encounter and > 50% was spent counseling as documented under my assessment and plan.      Derwood Kaplan, MD [facility] [address] [place] [address] Dept: [phone] Dept Fax: [phone]    CHIEF COMPLAINT:   CC: History of stage 0 ductal carcinoma in situ   Current Treatment:  Surveillance   HISTORY OF PRESENT ILLNESS:  Jennifer Waller is a 77 y.o. 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  She was treated with lumpectomy.  Pathology reveals a 2 cm, low to intermediate grade, ductal carcinoma in situ with a negative intramammary node.   Estrogen and progesterone receptors were negative.  She was placed on the NSABP clinical trial B43, but HER 2 Neu testing was negative.  She received adjuvant radiation to the left breast, completed in October 2014.  She was placed on raloxifene for chemoprevention in November 2014.  She has osteoporosis, but did not tolerate alendronate, ibandronate or denosumab.  Bone density scan in September 2018 revealed worsening bone density with a T-score -2.9 in the spine and a T-score of -2.9 in femur, which was decreased from previous by 5.4% and 4.3% respectively.  We therefore recommended she try yearly Reclast for the osteoporosis, which she started last year.  She completed her 5 years of Raloxifene in 2019.   She had her annual mammogram at the end of October 2020 which was clear, and she has followed up with Dr. Noberto Retort, but requests me to do her follow up going forward.  She also had a bone density scan on October 30th which revealed a T-score of -3.5 of the AP spine L1-L3, previously -3.2, and a T score of -2.8 of the femur neck (left), previously -2.9.  Both of these scores are considered osteoporotic.  She continues Os-Cal Ultra twice daily.  She was supposed to undergo surgery for degenerative disc disease back in August, but she contracted [facility] Spotted Fever.  This has been rescheduled for January with Dr. Delaney Meigs this had to be canceled due to her severe osteoporosis.  The Reclast was stopped and she was placed Tymlos injections daily for the last year.     INTERVAL HISTORY:  Dezarey is here for annual follow up for history of stage 0 ductal carcinoma in situ. Patient states that she is well and complains of pain and soreness in her right lateral chest muscle. She had 18 months of the Tymlos injections and her bone density scan from last year is improved to stable.  She is past due for her annual mammogram and I will schedule that. Her labs today are pending and I will add magnesium and  TSH levels. I will see her back in a little over a year with CBC, CMP, bone density and screening bilateral mammogram. She denies signs of infection such as sore throat, sinus drainage, cough, or urinary symptoms.  She denies fevers or recurrent chills. She denies pain. She denies nausea, vomiting, chest pain, dyspnea or cough. Her appetite is good and her weight has been stable.  REVIEW OF SYMPTOMS:  Review of Systems  Constitutional: Negative.  Negative for appetite change, chills, diaphoresis, fatigue, fever and unexpected weight change.  HENT:  Negative.  Negative for hearing loss, lump/mass, mouth sores, nosebleeds, sore throat, tinnitus, trouble swallowing and voice change.   Eyes: Negative.  Negative for eye problems and icterus.  Respiratory: Negative.  Negative for chest tightness, cough, hemoptysis, shortness of breath and wheezing.   Cardiovascular: Negative.  Negative for chest pain (right lateral chest muscle), leg swelling and palpitations.  Gastrointestinal: Negative.  Negative for abdominal distention, abdominal pain, blood in stool, constipation, diarrhea, nausea, rectal pain and vomiting.  Endocrine: Negative.   Genitourinary: Negative.  Negative for bladder incontinence, difficulty urinating, dyspareunia, dysuria, frequency, hematuria, menstrual problem, nocturia, pelvic pain, vaginal bleeding and vaginal discharge.   Musculoskeletal:  Positive for arthralgias. Negative for back pain, flank pain, gait problem, myalgias, neck pain and neck stiffness.  Skin: Negative.  Negative for itching, rash and wound.  Neurological:  Negative for dizziness, extremity weakness, gait problem, headaches, light-headedness, numbness, seizures and speech difficulty.  Hematological: Negative.  Negative for adenopathy. Does not bruise/bleed easily.  Psychiatric/Behavioral: Negative.  Negative for confusion, decreased concentration, depression, sleep disturbance and suicidal ideas. The patient is not  nervous/anxious.    VITALS:   BSA: 1.79 m Wt: 167 lb 11.2 oz (76.1 kg) Ht: 5' (1.524 m) BP: 127/72 BMI: 32.75 kg/m  PHYSICAL EXAM:  Physical Exam Vitals and nursing note reviewed.  Constitutional:      General: She is not in acute distress.    Appearance: Normal appearance. She is normal weight. She is not ill-appearing, toxic-appearing or diaphoretic.  HENT:     Head: Normocephalic and atraumatic.     Right Ear: Tympanic membrane, ear canal and external ear normal. There is no impacted cerumen.     Left Ear: Tympanic membrane, ear canal and external ear normal. There is no impacted cerumen.     Nose: Nose normal. No congestion or rhinorrhea.     Mouth/Throat:     Mouth: Mucous membranes are moist.     Pharynx: Oropharynx is clear. No oropharyngeal exudate or posterior oropharyngeal erythema.  Eyes:     General: No scleral icterus.       Right eye: No discharge.        Left eye: No discharge.     Extraocular Movements: Extraocular movements intact.     Conjunctiva/sclera: Conjunctivae normal.     Pupils: Pupils are equal, round, and reactive to light.  Neck:     Vascular: No carotid bruit.  Cardiovascular:     Rate and Rhythm: Normal rate and regular rhythm.     Pulses: Normal pulses.     Heart sounds: Normal heart sounds. No murmur heard.    No friction rub. No gallop.  Pulmonary:     Effort: Pulmonary effort is normal. No respiratory distress.     Breath sounds: Normal breath sounds. No stridor. No wheezing, rhonchi or rales.  Chest:     Chest wall: No tenderness.     Comments: Her left breast has a well healed incision of the upper outer quadrant at about 2 o'clock.   Abdominal:     General: Bowel sounds are normal. There is no distension.     Palpations: Abdomen is soft. There is no hepatomegaly, splenomegaly or mass.     Tenderness: There is no abdominal tenderness. There is no right CVA tenderness, left CVA tenderness, guarding or rebound.     Hernia: No  hernia is present.  Musculoskeletal:        General: No swelling, tenderness, deformity or signs of injury. Normal range of motion.     Cervical back: Normal range of motion and neck supple. No rigidity or tenderness.     Right lower leg: No edema.     Left lower leg: No edema.  Lymphadenopathy:     Cervical: No cervical adenopathy.     Right cervical: No superficial, deep or posterior cervical adenopathy.    Left cervical: No superficial, deep or posterior cervical adenopathy.     Upper Body:     Right upper body: No supraclavicular, axillary or pectoral adenopathy.     Left upper body: No supraclavicular, axillary or pectoral adenopathy.  Skin:    General: Skin is warm and dry.     Coloration: Skin is not jaundiced or pale.     Findings: No bruising, erythema, lesion or rash.  Neurological:     General: No focal deficit present.     Mental Status: She is alert and oriented to person, place, and time. Mental status is at baseline.     Cranial Nerves: No cranial nerve deficit.     Sensory: No sensory deficit.     Motor: No weakness.     Coordination: Coordination normal.     Gait: Gait normal.     Deep Tendon Reflexes: Reflexes normal.  Psychiatric:        Mood and Affect: Mood normal.        Behavior: Behavior normal.        Thought Content: Thought content normal.        Judgment: Judgment normal.    HISTORY:   Allergies  Allergen Reactions   Doxycycline Nausea And Vomiting and Other (See Comments)   Ibandronic Acid Nausea Only and Other (See Comments)   Past Medical History:  Diagnosis Date   Allergy    GERD (gastroesophageal reflux disease)    Osteoporosis    Past Surgical History:  Procedure Laterality Date   ABDOMINAL HYSTERECTOMY  1976   due to fibroids   APPENDECTOMY  1963   BREAST BIOPSY     CARPAL TUNNEL RELEASE Right    CHOLECYSTECTOMY  1985   SALPINGOOPHORECTOMY Bilateral    Family History  Problem Relation Age of Onset   Lung cancer Sister     Skin cancer Maternal Grandfather    Cancer Paternal Grandmother        neck   Social History  Substance and Sexual Activity  Alcohol Use Never    Social History   Substance and Sexual Activity  Drug Use Never    Social History   Tobacco Use  Smoking Status Never  Smokeless Tobacco Never    Current Outpatient Medications:    albuterol (VENTOLIN HFA) 108 (90 Base) MCG/ACT inhaler, Inhale into the lungs., Disp: , Rfl:    amLODipine (NORVASC) 5 MG tablet, Take 5 mg by mouth daily., Disp: , Rfl:    atenolol (TENORMIN) 25 MG tablet, Take 25 mg by mouth daily., Disp: , Rfl:    calcium carbonate (OSCAL) 1500 (600 Ca) MG TABS tablet, Take 3 tablets by mouth every evening. Increased to 3 tablets on 10/14/2022, Disp: , Rfl:    Cholecalciferol 50 MCG (2000 UT) CAPS, Take 1 tablet by mouth every evening., Disp: , Rfl:    fluticasone (FLONASE) 50 MCG/ACT nasal spray, Place 1 spray into both nostrils 2 (two) times daily as needed., Disp: , Rfl:    Fluticasone-Salmeterol (ADVAIR DISKUS) 250-50 MCG/DOSE AEPB, INHALE 1 DOSE BY MOUTH EVERY 12 HOURS, Disp: , Rfl:    gabapentin (NEURONTIN) 300 MG capsule, SMARTSIG:1 Capsule(s) By Mouth 4-5 Times Daily, Disp: , Rfl:    losartan-hydrochlorothiazide (HYZAAR) 50-12.5 MG tablet, Take 1 tablet by mouth daily., Disp: , Rfl:    Magnesium Oxide 400 MG CAPS, Take by mouth., Disp: , Rfl:    Multiple Vitamins-Minerals (CENTRUM SILVER PO), Take 1 tablet by mouth every evening., Disp: , Rfl:    Omega-3 1000 MG CAPS, Take by mouth., Disp: , Rfl:    omeprazole (PRILOSEC) 40 MG capsule, Take 1 capsule by mouth daily., Disp: , Rfl:    pravastatin (PRAVACHOL) 20 MG tablet, Take 1 tablet by mouth daily., Disp: , Rfl:    primidone (MYSOLINE) 50 MG tablet, Take by mouth., Disp: , Rfl:    promethazine-dextromethorphan (PROMETHAZINE-DM) 6.25-15 MG/5ML syrup, Take 5 mLs by mouth every 4 (four) hours as needed., Disp: , Rfl:    LABS:   CBC  Ref Range & Units    06/16/2022  WBC 4.4 - 11.0 x 10*3/uL 8.5  RBC 4.10 - 5.10 x 10*6/uL 4.94  Hemoglobin 12.3 - 15.3 G/DL 14.2  Hematocrit 35.9 - 44.6 % 43.6  MCV 80.0 - 96.0 FL 88.2  MCH 27.5 - 33.2 PG 28.7  MCHC 33.0 - 37.0 G/DL 32.5 Low   RDW 12.3 - 17.0 % 15.1  Platelets 150 - 450 X 10*3/uL 278  MPV 6.8 - 10.2 FL 8.0    CMP Ref Range & Units 05/26/2022  Sodium 135 - 146 MMOL/L 137  Potassium 3.5 - 5.3 MMOL/L 3.8  Comment: NO VISIBLE HEMOLYSIS  Chloride 98 - 110 MMOL/L 101  CO2 21 - 31 MMOL/L 23  BUN 8 - 24 MG/DL 11  Glucose 70 - 99 MG/DL 128 High   Creatinine 0.60 - 1.20 MG/DL 0.73  Calcium 8.5 - 10.5 MG/DL 8.6  Total Protein 6.4 - 8.9 G/DL 7.1  Albumin 3.5 - 5.7 G/DL 4.1  Total Bilirubin 0.0 - 1.0 MG/DL 0.6  Alkaline Phosphatase 34 - 104 IU/L or U/L 117 High   AST (SGOT) 13 - 39 IU/L or U/L 72 High   ALT (SGPT) 7 - 52 IU/L or U/L 58 High     Ref Range & Units 4 mo ago 05/26/2022  HEMOGLOBIN A1C <5.8 % 6.4 High   eAG MG/DL 137    Ref Range & Units 4 mo ago 05/26/2022  LDL Direct <100 mg/dL  103 High   Total Cholesterol <200 MG/DL 158  Triglycerides <150 MG/DL 183 High   HDL Cholesterol >=60 MG/DL 40 Low   Total Chol / HDL Cholesterol <4.5 4.0  Non-HDL Cholesterol MG/DL 118    Ref Range & Units 4 mo ago 05/26/2022  FREE T4 0.6 - 1.3 NG/DL 1.0    Ref Range & Units 05/26/2022  TSH 0.45 - 5.00 UIU/ML 3.62    Ref Range & Units 4 mo ago 05/26/2022  GGT 9 - 64 IU/L or U/L 93 High    STUDIES:  Exam: 10/13/2021 Dual X-ray Absorptiometry (DEXA) for Bone Mineral Impression:  DualFemur Neck Left 10/13/2021  75.1 Osteopenia  -2.2  0.726 g/cm2 DualFemur Total Mean 10/13/2021  75.1 Osteopenia  -1.6  0.811 g/cm2 Left Forearm Radius 33% 10/13/2021  75.1 Osteoporosis  -2.5  0.669 g/cm2      I,Jasmine M Lassiter,acting as a scribe for Derwood Kaplan, MD.,have documented all relevant documentation on the behalf of Derwood Kaplan, MD,as directed by  Derwood Kaplan, MD while in  the presence of Derwood Kaplan, MD.

## 2022-10-11 ENCOUNTER — Other Ambulatory Visit: Payer: Self-pay | Admitting: Oncology

## 2022-10-11 ENCOUNTER — Inpatient Hospital Stay (INDEPENDENT_AMBULATORY_CARE_PROVIDER_SITE_OTHER): Payer: Medicare Other | Admitting: Oncology

## 2022-10-11 ENCOUNTER — Inpatient Hospital Stay: Payer: Medicare Other | Attending: Oncology

## 2022-10-11 ENCOUNTER — Other Ambulatory Visit: Payer: Self-pay

## 2022-10-11 ENCOUNTER — Encounter: Payer: Self-pay | Admitting: Oncology

## 2022-10-11 VITALS — BP 127/72 | HR 79 | Temp 98.0°F | Resp 18 | Ht 60.0 in | Wt 167.7 lb

## 2022-10-11 DIAGNOSIS — M810 Age-related osteoporosis without current pathological fracture: Secondary | ICD-10-CM

## 2022-10-11 DIAGNOSIS — Z79899 Other long term (current) drug therapy: Secondary | ICD-10-CM | POA: Insufficient documentation

## 2022-10-11 DIAGNOSIS — D0511 Intraductal carcinoma in situ of right breast: Secondary | ICD-10-CM | POA: Diagnosis not present

## 2022-10-11 DIAGNOSIS — Z1231 Encounter for screening mammogram for malignant neoplasm of breast: Secondary | ICD-10-CM

## 2022-10-11 DIAGNOSIS — Z86000 Personal history of in-situ neoplasm of breast: Secondary | ICD-10-CM | POA: Insufficient documentation

## 2022-10-11 DIAGNOSIS — E039 Hypothyroidism, unspecified: Secondary | ICD-10-CM

## 2022-10-11 HISTORY — DX: Hypothyroidism, unspecified: E03.9

## 2022-10-11 LAB — CMP (CANCER CENTER ONLY)
ALT: 38 U/L (ref 0–44)
AST: 53 U/L — ABNORMAL HIGH (ref 15–41)
Albumin: 3.8 g/dL (ref 3.5–5.0)
Alkaline Phosphatase: 105 U/L (ref 38–126)
Anion gap: 9 (ref 5–15)
BUN: 13 mg/dL (ref 8–23)
CO2: 24 mmol/L (ref 22–32)
Calcium: 8.5 mg/dL — ABNORMAL LOW (ref 8.9–10.3)
Chloride: 101 mmol/L (ref 98–111)
Creatinine: 0.68 mg/dL (ref 0.44–1.00)
GFR, Estimated: >60 mL/min (ref >60)
Glucose, Bld: 117 mg/dL — ABNORMAL HIGH (ref 70–99)
Potassium: 3.7 mmol/L (ref 3.5–5.1)
Sodium: 134 mmol/L — ABNORMAL LOW (ref 135–145)
Total Bilirubin: 0.6 mg/dL (ref 0.3–1.2)
Total Protein: 8 g/dL (ref 6.5–8.1)

## 2022-10-11 LAB — CBC WITH DIFFERENTIAL (CANCER CENTER ONLY)
Abs Immature Granulocytes: 0.03 10*3/uL (ref 0.00–0.07)
Basophils Absolute: 0.1 10*3/uL (ref 0.0–0.1)
Basophils Relative: 1 %
Eosinophils Absolute: 0.1 10*3/uL (ref 0.0–0.5)
Eosinophils Relative: 2 %
HCT: 45.3 % (ref 36.0–46.0)
Hemoglobin: 14.6 g/dL (ref 12.0–15.0)
Immature Granulocytes: 0 %
Lymphocytes Relative: 22 %
Lymphs Abs: 1.8 10*3/uL (ref 0.7–4.0)
MCH: 29.7 pg (ref 26.0–34.0)
MCHC: 32.2 g/dL (ref 30.0–36.0)
MCV: 92.1 fL (ref 80.0–100.0)
Monocytes Absolute: 0.4 10*3/uL (ref 0.1–1.0)
Monocytes Relative: 5 %
Neutro Abs: 5.7 10*3/uL (ref 1.7–7.7)
Neutrophils Relative %: 70 %
Platelet Count: 283 10*3/uL (ref 150–400)
RBC: 4.92 MIL/uL (ref 3.87–5.11)
RDW: 15.9 % — ABNORMAL HIGH (ref 11.5–15.5)
WBC Count: 8.1 10*3/uL (ref 4.0–10.5)
nRBC: 0 % (ref 0.0–0.2)

## 2022-10-11 LAB — TSH: TSH: 4.596 u[IU]/mL — ABNORMAL HIGH (ref 0.350–4.500)

## 2022-10-11 LAB — MAGNESIUM: Magnesium: 1.9 mg/dL (ref 1.7–2.4)

## 2022-10-13 LAB — HM MAMMOGRAPHY

## 2022-10-14 ENCOUNTER — Telehealth: Payer: Self-pay

## 2022-10-14 NOTE — Telephone Encounter (Signed)
-----   Message from Belva Chimes, LPN sent at 02/14/239  1:16 PM EST ----- Regarding: FW: call  ----- Message ----- From: Derwood Kaplan, MD Sent: 10/14/2022  12:41 PM EST To: Belva Chimes, LPN Subject: call                                           Tell her labs look good, BS 117, liver tests better, nearly normal, thyroid and magnesium are good. But her calcium is low, needs to take 1 bid (or increase to tid if she is already taking that)

## 2022-10-14 NOTE — Telephone Encounter (Signed)
Patient notified and voiced understanding of lab results currently taking calcium 2 tabs daily will increase to 3 tabs daily. When I added the third pill the computer said over the daily limit by 13%. There was a choice benefit outweighs risk is that ok or do I need to change the dose?

## 2022-10-24 ENCOUNTER — Other Ambulatory Visit: Payer: Self-pay | Admitting: Oncology

## 2022-10-24 ENCOUNTER — Telehealth: Payer: Self-pay

## 2022-10-24 DIAGNOSIS — Z1231 Encounter for screening mammogram for malignant neoplasm of breast: Secondary | ICD-10-CM

## 2022-10-24 DIAGNOSIS — M810 Age-related osteoporosis without current pathological fracture: Secondary | ICD-10-CM

## 2022-10-24 DIAGNOSIS — D0511 Intraductal carcinoma in situ of right breast: Secondary | ICD-10-CM

## 2022-10-24 NOTE — Telephone Encounter (Signed)
Mammo was done on 10/13/22 at [facility].

## 2022-10-24 NOTE — Telephone Encounter (Signed)
-----   Message from Derwood Kaplan, MD sent at 10/24/2022  1:23 PM EDT ----- Regarding: RE: Orders Needed I will need to know when this year's mammo is so I can schedule at least 1 year later ----- Message ----- From: Mellody Drown Sent: 10/18/2022   9:36 AM EDT To: Derwood Kaplan, MD; Belva Chimes, LPN Subject: Orders Needed                                  Good morning,   May I have orders for patients Mammo & Dexa for 2025.   Last LOS from 10/11/22: Follow-up disposition: Return in about 1 year (around 10/18/2023). Check out comments: Sched bilateral scr mammogram now  RT 1 year with bil mammo and bone density and CBC, CMP (Will have to be later in month so will be over a year since mammo this year)    Thank you!  Jennifer F.

## 2022-10-25 ENCOUNTER — Encounter: Payer: Self-pay | Admitting: Oncology

## 2022-10-26 ENCOUNTER — Telehealth: Payer: Self-pay

## 2022-10-26 NOTE — Telephone Encounter (Signed)
Per mammogram report- no findings, mammogram negative. Recommend f/u on 1 yr.

## 2022-11-08 ENCOUNTER — Other Ambulatory Visit: Payer: Self-pay | Admitting: Oncology

## 2022-11-08 DIAGNOSIS — D0511 Intraductal carcinoma in situ of right breast: Secondary | ICD-10-CM

## 2022-11-22 ENCOUNTER — Encounter: Payer: Self-pay | Admitting: Orthopedic Surgery

## 2022-11-30 DIAGNOSIS — H53129 Transient visual loss, unspecified eye: Secondary | ICD-10-CM

## 2022-11-30 DIAGNOSIS — I361 Nonrheumatic tricuspid (valve) insufficiency: Secondary | ICD-10-CM

## 2022-11-30 DIAGNOSIS — E039 Hypothyroidism, unspecified: Secondary | ICD-10-CM

## 2022-11-30 DIAGNOSIS — R55 Syncope and collapse: Secondary | ICD-10-CM

## 2022-12-01 ENCOUNTER — Telehealth: Payer: Self-pay

## 2022-12-01 DIAGNOSIS — R42 Dizziness and giddiness: Secondary | ICD-10-CM

## 2022-12-01 DIAGNOSIS — H53129 Transient visual loss, unspecified eye: Secondary | ICD-10-CM | POA: Diagnosis not present

## 2022-12-01 DIAGNOSIS — E039 Hypothyroidism, unspecified: Secondary | ICD-10-CM | POA: Diagnosis not present

## 2022-12-01 DIAGNOSIS — R55 Syncope and collapse: Secondary | ICD-10-CM | POA: Diagnosis not present

## 2022-12-01 NOTE — Telephone Encounter (Signed)
Per Dr. Bing Matter, order entered for Zio 14 day

## 2022-12-02 ENCOUNTER — Ambulatory Visit: Payer: Medicare Other | Attending: Cardiology

## 2022-12-02 DIAGNOSIS — R42 Dizziness and giddiness: Secondary | ICD-10-CM | POA: Diagnosis not present

## 2023-01-04 ENCOUNTER — Telehealth: Payer: Self-pay

## 2023-01-04 NOTE — Telephone Encounter (Signed)
Called regarding monitor results. VM full.

## 2023-01-05 ENCOUNTER — Telehealth: Payer: Self-pay

## 2023-01-05 NOTE — Telephone Encounter (Signed)
Spoke with pt regarding results per Dr. Vanetta Shawl notes. Advised to make follow up appt to discuss test and any further testing. Pt agreed and verbalized understanding. Sent to front desk for appt.

## 2023-02-20 ENCOUNTER — Telehealth (HOSPITAL_COMMUNITY): Payer: Self-pay

## 2023-02-20 ENCOUNTER — Ambulatory Visit: Payer: Medicare Other | Attending: Cardiology | Admitting: Cardiology

## 2023-02-20 ENCOUNTER — Encounter: Payer: Self-pay | Admitting: Cardiology

## 2023-02-20 VITALS — BP 106/70 | HR 87 | Ht 62.0 in | Wt 170.4 lb

## 2023-02-20 DIAGNOSIS — I10 Essential (primary) hypertension: Secondary | ICD-10-CM

## 2023-02-20 DIAGNOSIS — I739 Peripheral vascular disease, unspecified: Secondary | ICD-10-CM | POA: Diagnosis present

## 2023-02-20 DIAGNOSIS — R0609 Other forms of dyspnea: Secondary | ICD-10-CM

## 2023-02-20 DIAGNOSIS — G250 Essential tremor: Secondary | ICD-10-CM

## 2023-02-20 DIAGNOSIS — E782 Mixed hyperlipidemia: Secondary | ICD-10-CM

## 2023-02-20 DIAGNOSIS — R55 Syncope and collapse: Secondary | ICD-10-CM

## 2023-02-20 DIAGNOSIS — R0789 Other chest pain: Secondary | ICD-10-CM | POA: Diagnosis present

## 2023-02-20 DIAGNOSIS — R42 Dizziness and giddiness: Secondary | ICD-10-CM | POA: Diagnosis not present

## 2023-02-20 DIAGNOSIS — K219 Gastro-esophageal reflux disease without esophagitis: Secondary | ICD-10-CM | POA: Insufficient documentation

## 2023-02-20 HISTORY — DX: Other forms of dyspnea: R06.09

## 2023-02-20 HISTORY — DX: Syncope and collapse: R55

## 2023-02-20 HISTORY — DX: Other chest pain: R07.89

## 2023-02-20 MED ORDER — ATENOLOL 25 MG PO TABS: 12.5000 mg | ORAL_TABLET | Freq: Two times a day (BID) | ORAL | 3 refills | Status: AC

## 2023-02-20 NOTE — Addendum Note (Signed)
Addended by: Baldo Ash D on: 02/20/2023 09:25 AM   Modules accepted: Orders

## 2023-02-20 NOTE — Patient Instructions (Signed)
Medication Instructions:   Atenolol 25mg  1/2 tablet in the morning and 1/2 in the evening    Lab Work: None Ordered If you have labs (blood work) drawn today and your tests are completely normal, you will receive your results only by: MyChart Message (if you have MyChart) OR A paper copy in the mail If you have any lab test that is abnormal or we need to change your treatment, we will call you to review the results.   Testing/Procedures:  +This test is an ultrasound of the veins in the legs. It looks at venous blood flow that carries blood from the heart to the legs. Allow one hour for a Lower Venous exam. There are no restrictions or special instructions.   Your physician has requested that you have a lexiscan myoview. For further information please visit [email or web address] Please follow instruction sheet, as given.  The test will take approximately 3 to 4 hours to complete; you may bring reading material.  If someone comes with you to your appointment, they will need to remain in the main lobby due to limited space in the testing area. How to prepare for your Myocardial Perfusion Test: Do not eat or drink 3 hours prior to your test, except you may have water. Do not consume products containing caffeine (regular or decaffeinated) 12 hours prior to your test. (ex: coffee, chocolate, sodas, tea). Do bring a list of your current medications with you.  If not listed below, you may take your medications as normal. Do not take atenolol (Tenormin) for 24 hours prior to the test.  Bring the medication to your appointment as you may be required to take it once the test is complete. Do wear comfortable clothes (no dresses or overalls) and walking shoes, tennis shoes preferred (No heels or open toe shoes are allowed). Do NOT wear cologne, perfume, aftershave, or lotions (deodorant is allowed). If these instructions are not followed, your test will have to be rescheduled.     Follow-Up: At  [facility], you and your health needs are our priority.  As part of our continuing mission to provide you with exceptional heart care, we have created designated Provider Care Teams.  These Care Teams include your primary Cardiologist (physician) and Advanced Practice Providers (APPs -  Physician Assistants and Nurse Practitioners) who all work together to provide you with the care you need, when you need it.  We recommend signing up for the patient portal called "MyChart".  Sign up information is provided on this After Visit Summary.  MyChart is used to connect with patients for Virtual Visits (Telemedicine).  Patients are able to view lab/test results, encounter notes, upcoming appointments, etc.  Non-urgent messages can be sent to your provider as well.   To learn more about what you can do with MyChart, go to [email or web address].    Your next appointment:   5 month(s)  The format for your next appointment:   In Person  Provider:   Gypsy Balsam, MD    Other Instructions NA

## 2023-02-20 NOTE — Telephone Encounter (Signed)
 Spoke with the patient, detailed instructions given. She stated that she would be here for her test. Asked to call back with any questions. S.Williams EMTP/CCT

## 2023-02-20 NOTE — Progress Notes (Signed)
Cardiology Office Note:    Date:  02/20/2023   ID:  Jennifer Waller, DOB [birth year], MRN [id number]  PCP:  Hadley Pen, MD  Cardiologist:  Gypsy Balsam, MD    Referring MD: Hadley Pen, MD   Chief Complaint  Patient presents with   Results   Shortness of Breath   Dizziness    History of Present Illness:    Jennifer Waller is a 77 y.o. female past medical history significant for essential hypertension, dyslipidemia, GERD, COPD, essential tremor.  I did see her in the hospital in April when she came to what appears to be episode of near syncope.  It look like she never completely passed out.  Interestingly she does follow-up episodes similar couple weeks before presentation to the hospital that happen when she was standing she started feeling profoundly weak and tired and was for to sit down for few minutes to catch her sinuses.  Luckily no episode of syncope at that time.  So far workup has been negative.  Echocardiogram done in the hospital showed preserved left ventricle ejection fraction left atrium was normal in size pulm artery pressure was normal.  She also got CT of her chest which show no evidence of pulmonary emboli, calcification of coronary arteries and aorta however it has been noted.  She did wear monitor which shows some episode of supraventricular tachycardia. Since the time of hospitalization history seems to be doing well.  She denies have any chest pain tightness squeezing pressure burning chest she does have shortness of breath sometimes when she walks she gets some uneasy sensation in the chest.  No more dizziness or passing out  Past Medical History:  Diagnosis Date   Allergy    GERD (gastroesophageal reflux disease)    Osteoporosis     Past Surgical History:  Procedure Laterality Date   ABDOMINAL HYSTERECTOMY  1976   due to fibroids   APPENDECTOMY  1963   BREAST BIOPSY     CARPAL TUNNEL RELEASE Right    CHOLECYSTECTOMY  1985    SALPINGOOPHORECTOMY Bilateral     Current Medications: Current Meds  Medication Sig   albuterol (VENTOLIN HFA) 108 (90 Base) MCG/ACT inhaler Inhale 2 puffs into the lungs every 6 (six) hours as needed for wheezing or shortness of breath.   amLODipine (NORVASC) 5 MG tablet Take 5 mg by mouth daily.   atenolol (TENORMIN) 25 MG tablet Take 25 mg by mouth daily.   calcium carbonate (OSCAL) 1500 (600 Ca) MG TABS tablet Take 3 tablets by mouth every evening. Increased to 3 tablets on 10/14/2022   fluticasone (FLONASE) 50 MCG/ACT nasal spray Place 1 spray into both nostrils 2 (two) times daily as needed for allergies or rhinitis.   Fluticasone Furoate-Vilanterol (BREO ELLIPTA IN) Inhale 1 puff into the lungs daily.   gabapentin (NEURONTIN) 300 MG capsule Take 300 mg by mouth at bedtime.   losartan-hydrochlorothiazide (HYZAAR) 50-12.5 MG tablet Take 1 tablet by mouth daily.   Magnesium Oxide 400 MG CAPS Take 1 capsule by mouth daily.   Multiple Vitamins-Minerals (CENTRUM SILVER PO) Take 1 tablet by mouth every evening.   Omega-3 1000 MG CAPS Take 1 capsule by mouth daily.   omeprazole (PRILOSEC) 40 MG capsule Take 1 capsule by mouth daily.   pravastatin (PRAVACHOL) 20 MG tablet Take 1 tablet by mouth daily.   primidone (MYSOLINE) 50 MG tablet Take 50 mg by mouth at bedtime.   [DISCONTINUED] Cholecalciferol 50 MCG (2000  UT) CAPS Take 1 tablet by mouth every evening.   [DISCONTINUED] Fluticasone-Salmeterol (ADVAIR DISKUS) 250-50 MCG/DOSE AEPB Inhale 1 puff into the lungs daily.   [DISCONTINUED] promethazine-dextromethorphan (PROMETHAZINE-DM) 6.25-15 MG/5ML syrup Take 5 mLs by mouth every 4 (four) hours as needed for cough.     Allergies:   Doxycycline and Ibandronic acid   Social History   Socioeconomic History   Marital status: Married    Spouse name: Not on file   Number of children: 1   Years of education: Not on file   Highest education level: Not on file  Occupational History   Not  on file  Tobacco Use   Smoking status: Never   Smokeless tobacco: Never  Substance and Sexual Activity   Alcohol use: Never   Drug use: Never   Sexual activity: Not Currently  Other Topics Concern   Not on file  Social History Narrative   Not on file   Social Determinants of Health   Financial Resource Strain: Not on file  Food Insecurity: Not on file  Transportation Needs: Not on file  Physical Activity: Not on file  Stress: Not on file  Social Connections: Not on file     Family History: The patient's family history includes Cancer in her paternal grandmother; Lung cancer in her sister; Skin cancer in her maternal grandfather. ROS:   Please see the history of present illness.    All 14 point review of systems negative except as described per history of present illness  EKGs/Labs/Other Studies Reviewed:         Recent Labs: 10/11/2022: ALT 38; BUN 13; Creatinine 0.68; Hemoglobin 14.6; Magnesium 1.9; Platelet Count 283; Potassium 3.7; Sodium 134; TSH 4.596  Recent Lipid Panel No results found for: "CHOL", "TRIG", "HDL", "CHOLHDL", "VLDL", "LDLCALC", "LDLDIRECT"  Physical Exam:    VS:  BP 106/70 (BP Location: Left Arm, Patient Position: Sitting)   Pulse 87   Ht 5\' 2"  (1.575 m)   Wt 170 lb 6.4 oz (77.3 kg)   SpO2 96%   BMI 31.17 kg/m     Wt Readings from Last 3 Encounters:  02/20/23 170 lb 6.4 oz (77.3 kg)  10/11/22 167 lb 11.2 oz (76.1 kg)  10/05/21 166 lb 12.8 oz (75.7 kg)     GEN:  Well nourished, well developed in no acute distress HEENT: Normal NECK: No JVD; No carotid bruits LYMPHATICS: No lymphadenopathy CARDIAC: RRR, no murmurs, no rubs, no gallops RESPIRATORY:  Clear to auscultation without rales, wheezing or rhonchi  ABDOMEN: Soft, non-tender, non-distended MUSCULOSKELETAL:  No edema; No deformity  SKIN: Warm and dry LOWER EXTREMITIES: no swelling NEUROLOGIC:  Alert and oriented x 3 PSYCHIATRIC:  Normal affect   ASSESSMENT:    1. Dizziness    2. Near syncope   3. Essential hypertension   4. Essential tremor   5. Mixed dyslipidemia   6. Dyspnea on exertion   7. Atypical chest pain    PLAN:    In order of problems listed above:  Dizziness/near syncope.  So far workup has been negative carotic ultrasound did not show any critical stenosis, monitor showed some episode of supraventricular tachycardia that does not explain her episode of near syncope, echocardiogram showed preserved left ventricle ejection fraction, so is overall low risk and therefore arrhythmia from cardiac standpoint review.  1 question still an answer about potential presence of significant coronary artery disease, therefore, I will schedule her to have a stress test to rule out significant coronary artery disease  I do this especially in view of the fact that she does have significant calcification of the coronary arteries. Essential hypertension blood pressure well-controlled today we will continue present management. Essential tremor that being followed by neurology.  She is taking Tenormin 25 mg daily I asked her to break the tablet in half take half in the morning half in the afternoon which should control her supraventricular tachycardia better. Supraventricular tachycardia overall benign arrhythmia she denies having any much palpitations.  Plan will be to simply spread Tenormin more evenly during the day hopefully by doing this will be able to suppress this arrhythmia. Dyslipidemia I did review her K PN which show me her LDL of 109 HDL 47 this is from May of this year.  She is on pravastatin 20 which I will continue for now.  Will do stress test and based on that we will decide if we need to improve medical therapy I suspect that will be the case since she does have already calcification of the coronary arteries.  I would like to see her LDL less than 70. I told her to stay well-hydrated also told her if she is standing up started feeling woozy she need to lay down  and sit down and simply wait. She described to have pain in her legs she blames it on her back problem however have difficulty palpating send her distal pulses lower extremities cold to segmental pressures   Medication Adjustments/Labs and Tests Ordered: Current medicines are reviewed at length with the patient today.  Concerns regarding medicines are outlined above.  Orders Placed This Encounter  Procedures   EKG 12-Lead   Medication changes: No orders of the defined types were placed in this encounter.   Signed, Georgeanna Lea, MD, [facility] 02/20/2023 9:06 AM    [facility]

## 2023-02-21 ENCOUNTER — Ambulatory Visit: Payer: Medicare Other | Attending: Cardiology

## 2023-02-21 DIAGNOSIS — R0789 Other chest pain: Secondary | ICD-10-CM | POA: Insufficient documentation

## 2023-02-21 LAB — MYOCARDIAL PERFUSION IMAGING
LV dias vol: 51 mL (ref 46–106)
LV sys vol: 21 mL
Nuc Stress EF: 60 %
Peak HR: 107 {beats}/min
Rest HR: 72 {beats}/min
Rest Nuclear Isotope Dose: 10.9 mCi
SDS: 1
SRS: 5
SSS: 6
ST Depression (mm): 0 mm
Stress Nuclear Isotope Dose: 30.3 mCi
TID: 1.22

## 2023-02-21 MED ORDER — TECHNETIUM TC 99M TETROFOSMIN IV KIT
10.9000 | PACK | Freq: Once | INTRAVENOUS | Status: AC | PRN
  Administered 2023-02-21: 10.9 via INTRAVENOUS

## 2023-02-21 MED ORDER — TECHNETIUM TC 99M TETROFOSMIN IV KIT
30.3000 | PACK | Freq: Once | INTRAVENOUS | Status: AC | PRN
  Administered 2023-02-21: 30.3 via INTRAVENOUS

## 2023-02-21 MED ORDER — REGADENOSON 0.4 MG/5ML IV SOLN
0.4000 mg | Freq: Once | INTRAVENOUS | Status: AC
  Administered 2023-02-21: 0.4 mg via INTRAVENOUS

## 2023-02-27 ENCOUNTER — Telehealth: Payer: Self-pay

## 2023-02-27 ENCOUNTER — Telehealth: Payer: Self-pay | Admitting: Cardiology

## 2023-02-27 NOTE — Telephone Encounter (Signed)
Results reviewed with pt as per Dr. Krasowski's note.  Pt verbalized understanding and had no additional questions. Routed to PCP  

## 2023-02-27 NOTE — Telephone Encounter (Signed)
Patient notified of results.

## 2023-02-27 NOTE — Telephone Encounter (Signed)
-----   Message from Gypsy Balsam sent at 02/22/2023  8:31 AM EDT ----- Stress test was normal

## 2023-02-27 NOTE — Telephone Encounter (Signed)
Patient was calling in for results. Please advise

## 2023-02-27 NOTE — Telephone Encounter (Signed)
Patient is calling follow up on results.

## 2023-04-05 ENCOUNTER — Ambulatory Visit: Payer: Medicare Other | Attending: Cardiology

## 2023-07-20 DIAGNOSIS — I493 Ventricular premature depolarization: Secondary | ICD-10-CM | POA: Diagnosis not present

## 2023-08-11 ENCOUNTER — Encounter: Payer: Self-pay | Admitting: Cardiology

## 2023-08-14 ENCOUNTER — Encounter: Payer: Self-pay | Admitting: Cardiology

## 2023-08-14 ENCOUNTER — Ambulatory Visit: Payer: Medicare Other | Attending: Cardiology | Admitting: Cardiology

## 2023-08-14 VITALS — BP 106/64 | HR 85 | Ht 62.0 in | Wt 164.4 lb

## 2023-08-14 DIAGNOSIS — R55 Syncope and collapse: Secondary | ICD-10-CM | POA: Diagnosis not present

## 2023-08-14 DIAGNOSIS — I10 Essential (primary) hypertension: Secondary | ICD-10-CM | POA: Insufficient documentation

## 2023-08-14 DIAGNOSIS — E782 Mixed hyperlipidemia: Secondary | ICD-10-CM | POA: Diagnosis present

## 2023-08-14 DIAGNOSIS — R931 Abnormal findings on diagnostic imaging of heart and coronary circulation: Secondary | ICD-10-CM | POA: Diagnosis present

## 2023-08-14 NOTE — Patient Instructions (Signed)
 Medication Instructions:  Your physician recommends that you continue on your current medications as directed. Please refer to the Current Medication list given to you today.  *If you need a refill on your cardiac medications before your next appointment, please call your pharmacy*   Lab Work: Your physician recommends that you return for lab work in: 1-2 months You need to have labs done when you are fasting.  You can come Monday through Friday 8:30 am to 12:00 pm and 1:15 to 4:30. You do not need to make an appointment as the order has already been placed. The labs you are going to have done are  Lipids.    Testing/Procedures: None Ordered   Follow-Up: At [facility], you and your health needs are our priority.  As part of our continuing mission to provide you with exceptional heart care, we have created designated Provider Care Teams.  These Care Teams include your primary Cardiologist (physician) and Advanced Practice Providers (APPs -  Physician Assistants and Nurse Practitioners) who all work together to provide you with the care you need, when you need it.  We recommend signing up for the patient portal called MyChart.  Sign up information is provided on this After Visit Summary.  MyChart is used to connect with patients for Virtual Visits (Telemedicine).  Patients are able to view lab/test results, encounter notes, upcoming appointments, etc.  Non-urgent messages can be sent to your provider as well.   To learn more about what you can do with MyChart, go to [email or web address].    Your next appointment:   6 month(s)  The format for your next appointment:   In Person  Provider:   Lamar Fitch, MD    Other Instructions NA

## 2023-08-14 NOTE — Progress Notes (Signed)
 Cardiology Office Note:    Date:  08/14/2023   ID:  Jennifer Waller, DOB Apr 15, 1946, MRN [id number]  PCP:  Silver Lamar LABOR, MD  Cardiologist:  Lamar Fitch, MD    Referring MD: Silver Lamar LABOR, MD   No chief complaint on file.   History of Present Illness:    Jennifer Waller is a 78 y.o. female past medical history significant for essential hypertension, essential tremor, supraventricular tachycardia, dyslipidemia she was referred to us  because of episode of syncope cardiac workup negative echocardiogram preserved ejection fraction monitor shows short runs of supraventricular tachycardia asymptomatic.  She also was found to have calcification of the coronary arteries after that stress test was done which showed no obstructive disease.  She comes today to months for follow-up.  At the beginning of December if she had GI infection some viral infection and that being in the hospital spent few days over there with a lot of all electrolytes abnormalities.  Recover but still not up to the park likely denies have any dizziness denies have any syncope.  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   Dupuytren's contracture of left hand 10/11/2019   Dyspnea on exertion 02/20/2023   Elevated glucose 05/13/2020   Environmental and seasonal allergies 10/11/2019   Essential hypertension 01/13/2017   Essential tremor 04/28/2021   GERD (gastroesophageal reflux disease)    History of lumbar surgery 11/09/2020   Hospital discharge follow-up 11/30/2020   Hypothyroidism 10/11/2022   Low magnesium level 12/25/2020   Lower respiratory infection (e.g., bronchitis, pneumonia, pneumonitis, pulmonitis) 02/11/2022   Lumbar degenerative disc disease 12/19/2017   Malaise and fatigue  05/13/2020   Medicare annual wellness visit, subsequent 03/28/2019   Mixed dyslipidemia 05/13/2020   Near syncope 02/20/2023   Need for immunization against influenza 05/13/2020   Osteoporosis    Other spondylosis with radiculopathy, lumbar region 11/09/2020   Pain in both lower legs 05/13/2020   Spinal stenosis in cervical region 06/20/2018   Vitamin B12 deficiency 01/13/2017   Vitamin D deficiency 01/13/2017    Past Surgical History:  Procedure Laterality Date   ABDOMINAL HYSTERECTOMY  1976   due to fibroids   APPENDECTOMY  1963   BREAST BIOPSY     CARPAL TUNNEL RELEASE Right    CHOLECYSTECTOMY  1985   SALPINGOOPHORECTOMY Bilateral     Current Medications: Current Meds  Medication Sig   albuterol (VENTOLIN HFA) 108 (90 Base) MCG/ACT inhaler Inhale 2 puffs into the lungs every 6 (six) hours as needed for wheezing or shortness of breath.   amLODipine (NORVASC) 5 MG tablet Take 5 mg by mouth daily.   atenolol  (TENORMIN ) 25 MG tablet Take 0.5 tablets (12.5 mg total) by mouth 2 (two) times daily.   busPIRone (BUSPAR) 10 MG tablet Take 1 tablet by mouth 3 (three) times daily.   calcium carbonate (OSCAL) 1500 (600 Ca) MG TABS tablet Take 3 tablets by mouth every evening. Increased to 3 tablets on 10/14/2022   fluticasone (FLONASE) 50 MCG/ACT nasal spray Place 1 spray into both nostrils 2 (two) times daily as needed for allergies or rhinitis.   Fluticasone Furoate-Vilanterol (BREO ELLIPTA IN) Inhale 1 puff into the lungs daily.   gabapentin (NEURONTIN) 300 MG capsule Take 300 mg by mouth at bedtime.  losartan-hydrochlorothiazide (HYZAAR) 50-12.5 MG tablet Take 1 tablet by mouth daily.   Magnesium Oxide 400 MG CAPS Take 1 capsule by mouth daily.   Multiple Vitamins-Minerals (CENTRUM SILVER PO) Take 1 tablet by mouth every evening.   Omega-3 1000 MG CAPS Take 1 capsule by mouth daily.   omeprazole (PRILOSEC) 40 MG capsule Take 1 capsule by mouth daily.   pravastatin  (PRAVACHOL ) 20 MG  tablet Take 1 tablet by mouth daily.   primidone (MYSOLINE) 50 MG tablet Take 50 mg by mouth at bedtime.   Probiotic Product (PROBIOTIC DAILY PO) Take 1 tablet by mouth daily.     Allergies:   Doxycycline and Ibandronic acid   Social History   Socioeconomic History   Marital status: Married    Spouse name: Not on file   Number of children: 1   Years of education: Not on file   Highest education level: Not on file  Occupational History   Not on file  Tobacco Use   Smoking status: Never   Smokeless tobacco: Never  Substance and Sexual Activity   Alcohol use: Never   Drug use: Never   Sexual activity: Not Currently  Other Topics Concern   Not on file  Social History Narrative   Not on file   Social Drivers of Health   Financial Resource Strain: Not on file  Food Insecurity: Low Risk  (07/26/2023)   Received from [facility]   Hunger Vital Sign    Worried About Running Out of Food in the Last Year: Never true    Ran Out of Food in the Last Year: Never true  Transportation Needs: No Transportation Needs (07/26/2023)   Received from [facility]    In the past 12 months, has lack of reliable transportation kept you from medical appointments, meetings, work or from getting things needed for daily living? : No  Physical Activity: Not on file  Stress: Not on file  Social Connections: Not on file     Family History: The patient's family history includes Cancer in her paternal grandmother; Lung cancer in her sister; Skin cancer in her maternal grandfather. ROS:   Please see the history of present illness.    All 14 point review of systems negative except as described per history of present illness  EKGs/Labs/Other Studies Reviewed:         Recent Labs: 10/11/2022: ALT 38; BUN 13; Creatinine 0.68; Hemoglobin 14.6; Magnesium 1.9; Platelet Count 283; Potassium 3.7; Sodium 134; TSH 4.596  Recent Lipid Panel No results found for: CHOL, TRIG, HDL,  CHOLHDL, VLDL, LDLCALC, LDLDIRECT  Physical Exam:    VS:  BP 106/64   Pulse 85   Ht 5' 2 (1.575 m)   Wt 164 lb 6.4 oz (74.6 kg)   SpO2 93%   BMI 30.07 kg/m     Wt Readings from Last 3 Encounters:  08/14/23 164 lb 6.4 oz (74.6 kg)  02/21/23 170 lb (77.1 kg)  02/20/23 170 lb 6.4 oz (77.3 kg)     GEN:  Well nourished, well developed in no acute distress HEENT: Normal NECK: No JVD; No carotid bruits LYMPHATICS: No lymphadenopathy CARDIAC: RRR, no murmurs, no rubs, no gallops RESPIRATORY:  Clear to auscultation without rales, wheezing or rhonchi  ABDOMEN: Soft, non-tender, non-distended MUSCULOSKELETAL:  No edema; No deformity  SKIN: Warm and dry LOWER EXTREMITIES: no swelling NEUROLOGIC:  Alert and oriented x 3 PSYCHIATRIC:  Normal affect   ASSESSMENT:    1. Near syncope  2. Essential hypertension   3. Elevated coronary artery calcium score    PLAN:    In order of problems listed above:  Near syncope.  Denies have any episodes.  I encouraged her to stay well-hydrated will continue watching. Essential hypertension: Blood pressure seems to be well-controlled continue present management. Elevated calcium score with stress test being negative.  She is on statin she is also on antiplatelet therapy which I will continue.  I do have last fasting lipid profile done by primary care physician in May showing LDL 109 HDL 47 which is still a bit elevated.  Will schedule to have fasting lipid profile done but I will give her about 2 months for recovery after GI illness.   Medication Adjustments/Labs and Tests Ordered: Current medicines are reviewed at length with the patient today.  Concerns regarding medicines are outlined above.  No orders of the defined types were placed in this encounter.  Medication changes: No orders of the defined types were placed in this encounter.   Signed, Lamar DOROTHA Fitch, MD, [facility] 08/14/2023 11:25 AM    [facility]

## 2023-09-21 LAB — LIPID PANEL
Chol/HDL Ratio: 4.2 {ratio} (ref 0.0–4.4)
Cholesterol, Total: 174 mg/dL (ref 100–199)
HDL: 41 mg/dL (ref >39)
LDL Chol Calc (NIH): 110 mg/dL — ABNORMAL HIGH (ref 0–99)
Triglycerides: 130 mg/dL (ref 0–149)
VLDL Cholesterol Cal: 23 mg/dL (ref 5–40)

## 2023-10-05 ENCOUNTER — Telehealth: Payer: Self-pay

## 2023-10-05 DIAGNOSIS — E782 Mixed hyperlipidemia: Secondary | ICD-10-CM

## 2023-10-05 MED ORDER — PRAVASTATIN SODIUM 40 MG PO TABS: 40.0000 mg | ORAL_TABLET | Freq: Every evening | ORAL | 3 refills | Status: AC

## 2023-10-05 NOTE — Telephone Encounter (Signed)
 Pt came by office to get results. Pravastatin 40mg  sent [facility]. Lipid, AST, ALT ordered for 6 weeks. Routed to PCP.

## 2023-10-05 NOTE — Telephone Encounter (Signed)
Att x 1. VM full 

## 2023-10-19 NOTE — Progress Notes (Incomplete)
 [facility]  [address] [place],  [place]  [address] [phone]  Clinic Day:  10/19/2023  Referring physician: Hadley Pen, MD    CHIEF COMPLAINT:  CC: History of stage 0 ductal carcinoma in situ  Current Treatment:  Surveillance  HISTORY OF PRESENT ILLNESS:  Jennifer Waller is a 78 y.o. 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  She was treated with lumpectomy.  Pathology reveals a 2 cm, low to intermediate grade, ductal carcinoma in situ with a negative intramammary node.  Estrogen and progesterone receptors were negative.  She was placed on the NSABP clinical trial B43, but HER 2 Neu testing was negative.  She received adjuvant radiation to the left breast, completed in October 2014.  She was placed on raloxifene for chemoprevention in November 2014.  She has osteoporosis, but did not tolerate alendronate, ibandronate or denosumab.  Bone density scan in September 2018 revealed worsening bone density with a T-score -2.9 in the spine and a T-score of -2.9 in femur, which was decreased from previous by 5.4% and 4.3% respectively.  We therefore recommended she try yearly Reclast for the osteoporosis, which she started last year.  She completed her 5 years of Raloxifene in 2019.  She had her annual mammogram at the end of October 2020 which was clear, and she has followed up with Dr. Georgiana Shore, but requests me to do her follow up going forward.  She also had a bone density scan on October 30th which revealed a T-score of -3.5 of the AP spine L1-L3, previously -3.2, and a T score of -2.8 of the femur neck (left), previously -2.9.  Both of these scores are considered osteoporotic.  She continues Os-Cal Ultra twice daily.  She was supposed to undergo surgery for degenerative disc disease back in August, but she contracted [facility] Spotted Fever.  This has been rescheduled for January with Dr.  Annitta Jersey this had to be canceled due to her severe osteoporosis.  The Reclast was stopped and she was placed Tymlos injections daily for the last year.    INTERVAL HISTORY:  Jennifer Waller is here for annual follow up for her history of stage 0 ductal carcinoma in situ. Patient states that she feels *** and ***.       She denies signs of infection such as sore throat, sinus drainage, cough, or urinary symptoms.  She denies fevers or recurrent chills. She denies pain. She denies nausea, vomiting, chest pain, dyspnea or cough. Her appetite is *** and her weight {Weight change:10426}.  , and states that she has been.     She will hopefully be able to pursue back surgery in February 2022 if this therapy is effective.  Therefore, she needs to have a bone density scan to assess her response.  After she completes 18 months, this will be discontinued.    She is scheduled for an MRI of the spine on Sunday.    She is past due for annual mammogram so we will schedule this as well as a bone density scan.    She undergoes routine blood work at Dr. Landry Dyke office.    Her  appetite is good, and she has gained 8 and 1/2 pounds since her last visit.  She denies fever, chills or other signs of infection.  She denies nausea, vomiting, bowel issues, or abdominal pain.  She denies sore throat, cough, dyspnea, or  chest pain.   REVIEW OF SYSTEMS:  Review of Systems  Constitutional: Negative.  Negative for appetite change, chills, diaphoresis, fever and unexpected weight change.  HENT:  Negative.  Negative for lump/mass, mouth sores and sore throat.   Eyes: Negative.  Negative for eye problems and icterus.  Respiratory: Negative.  Negative for chest tightness, cough, hemoptysis, shortness of breath and wheezing.   Cardiovascular: Negative.  Negative for chest pain, leg swelling and palpitations.  Gastrointestinal: Negative.  Negative for abdominal distention, abdominal pain, blood in stool, constipation,  diarrhea, nausea and vomiting.  Endocrine: Negative.   Genitourinary: Negative.  Negative for bladder incontinence, difficulty urinating, dyspareunia, dysuria, frequency, hematuria, menstrual problem, nocturia, pelvic pain, vaginal bleeding and vaginal discharge.   Musculoskeletal:  Positive for back pain. Negative for arthralgias, flank pain, gait problem, myalgias, neck pain and neck stiffness.  Skin: Negative.  Negative for itching, rash and wound.  Neurological:  Negative for dizziness, extremity weakness, gait problem, headaches, light-headedness, numbness, seizures and speech difficulty.  Hematological: Negative.  Negative for adenopathy. Does not bruise/bleed easily.  Psychiatric/Behavioral: Negative.  Negative for confusion, decreased concentration, depression, sleep disturbance and suicidal ideas. The patient is not nervous/anxious.   All other systems reviewed and are negative.    VITALS:  There were no vitals taken for this visit.  Wt Readings from Last 3 Encounters:  08/14/23 164 lb 6.4 oz (74.6 kg)  02/21/23 170 lb (77.1 kg)  02/20/23 170 lb 6.4 oz (77.3 kg)    There is no height or weight on file to calculate BMI.  Performance status (ECOG): 1 - Symptomatic but completely ambulatory  PHYSICAL EXAM:  Physical Exam Vitals and nursing note reviewed.  Constitutional:      General: She is not in acute distress.    Appearance: Normal appearance. She is normal weight. She is not ill-appearing, toxic-appearing or diaphoretic.  HENT:     Head: Normocephalic and atraumatic.     Right Ear: Tympanic membrane, ear canal and external ear normal. There is no impacted cerumen.     Left Ear: Tympanic membrane, ear canal and external ear normal. There is no impacted cerumen.     Nose: Nose normal. No congestion or rhinorrhea.     Mouth/Throat:     Mouth: Mucous membranes are moist.     Pharynx: Oropharynx is clear. No oropharyngeal exudate or posterior oropharyngeal erythema.  Eyes:      General: No scleral icterus.       Right eye: No discharge.        Left eye: No discharge.     Extraocular Movements: Extraocular movements intact.     Conjunctiva/sclera: Conjunctivae normal.     Pupils: Pupils are equal, round, and reactive to light.  Neck:     Vascular: No carotid bruit.  Cardiovascular:     Rate and Rhythm: Normal rate and regular rhythm.     Pulses: Normal pulses.     Heart sounds: Normal heart sounds. No murmur heard.    No friction rub. No gallop.  Pulmonary:     Effort: Pulmonary effort is normal. No respiratory distress.     Breath sounds: Normal breath sounds.  Chest:  Breasts:    Right: Normal.     Left: Normal.     Comments: Well healed scar in the lateral left breast.  Fibrocystic changes in the inferior inner lower quadrant of the left breast. Abdominal:     General: Bowel sounds are normal. There is no distension.  Palpations: Abdomen is soft. There is no hepatomegaly, splenomegaly or mass.     Tenderness: There is no abdominal tenderness. There is no right CVA tenderness, left CVA tenderness, guarding or rebound.     Hernia: No hernia is present.  Musculoskeletal:        General: No swelling, tenderness, deformity or signs of injury. Normal range of motion.     Cervical back: Normal range of motion and neck supple. No rigidity or tenderness.     Right lower leg: No edema.     Left lower leg: No edema.  Lymphadenopathy:     Cervical: No cervical adenopathy.     Right cervical: No superficial, deep or posterior cervical adenopathy.    Left cervical: No superficial, deep or posterior cervical adenopathy.     Upper Body:     Right upper body: No supraclavicular, axillary or pectoral adenopathy.     Left upper body: No supraclavicular, axillary or pectoral adenopathy.  Skin:    General: Skin is warm and dry.     Coloration: Skin is not jaundiced or pale.     Findings: No bruising, erythema, lesion or rash.  Neurological:     General: No  focal deficit present.     Mental Status: She is alert and oriented to person, place, and time. Mental status is at baseline.     Cranial Nerves: No cranial nerve deficit.     Sensory: No sensory deficit.     Motor: No weakness.     Coordination: Coordination normal.     Gait: Gait normal.     Deep Tendon Reflexes: Reflexes normal.  Psychiatric:        Mood and Affect: Mood normal.        Behavior: Behavior normal.        Thought Content: Thought content normal.        Judgment: Judgment normal.     LABS:      Latest Ref Rng & Units 10/11/2022    9:54 AM 06/17/2020    1:17 PM 06/17/2020   12:00 AM  CBC  WBC 4.0 - 10.5 K/uL 8.1  15.5  7.8      Hemoglobin 12.0 - 15.0 g/dL 16.1  09.6  04.5      Hematocrit 36.0 - 46.0 % 45.3  48.6  47      Platelets 150 - 400 K/uL 283  375  278         This result is from an external source.      Latest Ref Rng & Units 10/11/2022    9:54 AM 06/17/2020    1:17 PM 06/17/2020   12:00 AM  CMP  Glucose 70 - 99 mg/dL 409  96    BUN 8 - 23 mg/dL 13  20  12       Creatinine 0.44 - 1.00 mg/dL 8.11  9.14  0.5      Sodium 135 - 145 mmol/L 134  141  141      Potassium 3.5 - 5.1 mmol/L 3.7  4.3  4.0      Chloride 98 - 111 mmol/L 101  104  106      CO2 22 - 32 mmol/L 24  26  24       Calcium 8.9 - 10.3 mg/dL 8.5  8.6  9.4      Total Protein 6.5 - 8.1 g/dL 8.0  7.8    Total Bilirubin 0.3 - 1.2 mg/dL 0.6  0.6  Alkaline Phos 38 - 126 U/L 105  131  117      AST 15 - 41 U/L 53  45  42      ALT 0 - 44 U/L 38  53  40         This result is from an external source.  No results found for: "IRON", "TIBC", "FERRITIN" Lab Results  Component Value Date   TSH 4.596 (H) 10/11/2022   STUDIES:  No results found.   Allergies:  Allergies  Allergen Reactions   Doxycycline Nausea And Vomiting and Other (See Comments)   Ibandronate Nausea Only and Other (See Comments)    Current Medications: Current Outpatient Medications  Medication Sig Dispense Refill    albuterol (VENTOLIN HFA) 108 (90 Base) MCG/ACT inhaler Inhale 2 puffs into the lungs every 6 (six) hours as needed for wheezing or shortness of breath.     amLODipine (NORVASC) 5 MG tablet Take 5 mg by mouth daily.     atenolol (TENORMIN) 25 MG tablet Take 0.5 tablets (12.5 mg total) by mouth 2 (two) times daily. 90 tablet 3   busPIRone (BUSPAR) 10 MG tablet Take 1 tablet by mouth 3 (three) times daily.     calcium carbonate (OSCAL) 1500 (600 Ca) MG TABS tablet Take 3 tablets by mouth every evening. Increased to 3 tablets on 10/14/2022     fluticasone (FLONASE) 50 MCG/ACT nasal spray Place 1 spray into both nostrils 2 (two) times daily as needed for allergies or rhinitis.     Fluticasone Furoate-Vilanterol (BREO ELLIPTA IN) Inhale 1 puff into the lungs daily.     gabapentin (NEURONTIN) 300 MG capsule Take 300 mg by mouth at bedtime.     losartan-hydrochlorothiazide (HYZAAR) 50-12.5 MG tablet Take 1 tablet by mouth daily.     Magnesium Oxide 400 MG CAPS Take 1 capsule by mouth daily.     Multiple Vitamins-Minerals (CENTRUM SILVER PO) Take 1 tablet by mouth every evening.     Omega-3 1000 MG CAPS Take 1 capsule by mouth daily.     omeprazole (PRILOSEC) 40 MG capsule Take 1 capsule by mouth daily.     pravastatin (PRAVACHOL) 40 MG tablet Take 1 tablet (40 mg total) by mouth every evening. 90 tablet 3   primidone (MYSOLINE) 50 MG tablet Take 50 mg by mouth at bedtime.     Probiotic Product (PROBIOTIC DAILY PO) Take 1 tablet by mouth daily.     No current facility-administered medications for this visit.     ASSESSMENT & PLAN:  Assessment:   1. 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  She remains without evidence of recurrence.  She completed 5 years of chemoprevention with raloxifene in November 2019.  2. Osteoporosis.  She was placed on Tymlos injections daily back in February 2021 by Dr. Precious Gilding.  We will plan to repeat bone  density scan to assess the effectiveness of her current therapy so that she can proceed with the planned surgery.  3. 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  She is hoping to pursue surgery in February 2022.  Plan: She was placed on Tymlos injections daily back in February 2021 by Dr. Precious Gilding, and is planning for back surgery, hopefully in February.  Since she is past due for annual mammogram I will schedule this along with a bone density scan to determine how effective the Tymlos has been and whether she will be able to proceed with  surgery safely.  Otherwise, we will plan to see her back in 1 year with bilateral mammogram for reexamination.  The patient understands the plans discussed today and is in agreement with them.  She knows to contact our office if she develops concerns regarding her breast cancer.  I provided 30 minutes of face-to-face time during this this encounter and > 50% was spent counseling as documented under my assessment and plan.   Dellia Beckwith, MD [facility] - [facility] DEPT OF [facility] [address] [place] [address] Dept: [phone] Dept Fax: [phone]   No orders of the defined types were placed in this encounter.   I,Jasmine M Lassiter,acting as a scribe for Dellia Beckwith, MD.,have documented all relevant documentation on the behalf of Dellia Beckwith, MD,as directed by  Dellia Beckwith, MD while in the presence of Dellia Beckwith, MD.

## 2023-10-20 ENCOUNTER — Inpatient Hospital Stay: Payer: Medicare Other | Admitting: Oncology

## 2023-10-20 ENCOUNTER — Inpatient Hospital Stay: Payer: Medicare Other

## 2023-10-27 LAB — HM DEXA SCAN

## 2023-10-27 LAB — HM MAMMOGRAPHY

## 2023-11-01 ENCOUNTER — Inpatient Hospital Stay (HOSPITAL_BASED_OUTPATIENT_CLINIC_OR_DEPARTMENT_OTHER): Admitting: Hematology and Oncology

## 2023-11-01 ENCOUNTER — Inpatient Hospital Stay: Attending: Hematology and Oncology

## 2023-11-01 ENCOUNTER — Encounter: Payer: Self-pay | Admitting: Hematology and Oncology

## 2023-11-01 VITALS — BP 136/81 | HR 73 | Temp 97.8°F | Resp 20 | Ht 62.0 in | Wt 163.5 lb

## 2023-11-01 DIAGNOSIS — Z1231 Encounter for screening mammogram for malignant neoplasm of breast: Secondary | ICD-10-CM

## 2023-11-01 DIAGNOSIS — R7401 Elevation of levels of liver transaminase levels: Secondary | ICD-10-CM | POA: Diagnosis not present

## 2023-11-01 DIAGNOSIS — Z87891 Personal history of nicotine dependence: Secondary | ICD-10-CM | POA: Diagnosis not present

## 2023-11-01 DIAGNOSIS — Z853 Personal history of malignant neoplasm of breast: Secondary | ICD-10-CM | POA: Insufficient documentation

## 2023-11-01 DIAGNOSIS — Z860100 Personal history of colon polyps, unspecified: Secondary | ICD-10-CM | POA: Diagnosis not present

## 2023-11-01 DIAGNOSIS — D0511 Intraductal carcinoma in situ of right breast: Secondary | ICD-10-CM

## 2023-11-01 LAB — CBC WITH DIFFERENTIAL (CANCER CENTER ONLY)
Abs Immature Granulocytes: 0.03 10*3/uL (ref 0.00–0.07)
Basophils Absolute: 0.1 10*3/uL (ref 0.0–0.1)
Basophils Relative: 1 %
Eosinophils Absolute: 0.2 10*3/uL (ref 0.0–0.5)
Eosinophils Relative: 2 %
HCT: 45.8 % (ref 36.0–46.0)
Hemoglobin: 15 g/dL (ref 12.0–15.0)
Immature Granulocytes: 0 %
Lymphocytes Relative: 21 %
Lymphs Abs: 1.6 10*3/uL (ref 0.7–4.0)
MCH: 29.3 pg (ref 26.0–34.0)
MCHC: 32.8 g/dL (ref 30.0–36.0)
MCV: 89.5 fL (ref 80.0–100.0)
Monocytes Absolute: 0.5 10*3/uL (ref 0.1–1.0)
Monocytes Relative: 6 %
Neutro Abs: 5.4 10*3/uL (ref 1.7–7.7)
Neutrophils Relative %: 70 %
Platelet Count: 258 10*3/uL (ref 150–400)
RBC: 5.12 MIL/uL — ABNORMAL HIGH (ref 3.87–5.11)
RDW: 14 % (ref 11.5–15.5)
WBC Count: 7.8 10*3/uL (ref 4.0–10.5)
nRBC: 0 % (ref 0.0–0.2)
nRBC: 0 /100{WBCs}

## 2023-11-01 LAB — CMP (CANCER CENTER ONLY)
ALT: 37 U/L (ref 0–44)
AST: 54 U/L — ABNORMAL HIGH (ref 15–41)
Albumin: 4.5 g/dL (ref 3.5–5.0)
Alkaline Phosphatase: 140 U/L — ABNORMAL HIGH (ref 38–126)
Anion gap: 14 (ref 5–15)
BUN: 13 mg/dL (ref 8–23)
CO2: 26 mmol/L (ref 22–32)
Calcium: 9.7 mg/dL (ref 8.9–10.3)
Chloride: 98 mmol/L (ref 98–111)
Creatinine: 0.68 mg/dL (ref 0.44–1.00)
GFR, Estimated: >60 mL/min (ref >60)
Glucose, Bld: 100 mg/dL — ABNORMAL HIGH (ref 70–99)
Potassium: 3.7 mmol/L (ref 3.5–5.1)
Sodium: 137 mmol/L (ref 135–145)
Total Bilirubin: 0.5 mg/dL (ref 0.0–1.2)
Total Protein: 8 g/dL (ref 6.5–8.1)

## 2023-11-01 NOTE — Progress Notes (Signed)
 [facility] [address] [place],  [place]  [address] [phone]      PATIENT CARE TEAM: Patient Care Team: [name], MD as PCP - General (Family Medicine) Flavio Huguenin, MD as Referring Physician (Orthopedic Surgery)  DATE OF VISIT: 11/01/23   CLINIC:  Long Term Survivorship   REASON FOR VISIT:  Routine follow-up for history of breast cancer   BRIEF ONCOLOGIC HISTORY:  Jennifer Waller is a 78 y.o. 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  She was treated with lumpectomy.  Pathology reveals a 2 cm, low to intermediate grade, ductal carcinoma in situ with a negative intramammary node.  Estrogen and progesterone receptors were negative.  She was placed on the NSABP clinical trial B43, but HER 2 Neu testing was negative.  She received adjuvant radiation to the left breast, completed in October 2014.  She was placed on raloxifene for chemoprevention in November 2014.  She has osteoporosis, but did not tolerate alendronate, ibandronate or denosumab.  Bone density scan in September 2018 revealed worsening bone density with a T-score -2.9 in the spine and a T-score of -2.9 in femur, which was decreased from previous by 5.4% and 4.3% respectively.  We therefore recommended she try yearly Reclast for the osteoporosis, which she started last year.  She completed 5 years of raloxifene in 2019.   Bilateral screening mammogram and October 2020 did not reveal any evidence of malignancy.  She also had a bone density scan in October 2020 which revealed osteoporosis a T-score of -3.5 of the AP spine L1-L3, previously -3.2, and a T score of -2.8 of the femur neck (left), previously -2.9.  She continued Os-Cal Ultra twice daily.  We placed her on Reclast annually.    She had wished to have spinal surgery in January 2021, but this was canceled due to severe osteoporosis.  Dr. Nadia Aurora  switched her to daily Tymlos injections at that time.  Bilateral screening mammogram in December 2021 did not reveal any evidence of malignancy.  Repeat bone density in December 2021 revealed significant improvement with a T-score of -1.9 in spine, a T-score of -2.4 in the left femur neck and a T-score of -1.8 in the total mean femur.  She underwent L4-S1 laminectomy and fusion in April 2022.  Dr. Nadia Aurora ordered a bone density scan in March 2023 revealed worsening bone density with a T-score of -2.5 in the forearm, a T-score of -2.2 in the femur neck and a T-score of -1.6 in the total hip. She states no additional treatment was recommended.  Annual bilateral screening mammograms have remained without evidence of malignancy.   Oncology History  Ductal carcinoma in situ of right breast  02/14/2013 Cancer Staging   Staging form: Breast, AJCC 7th Edition - Clinical stage from 02/14/2013: Stage 0 (Tis (DCIS), N0, M0) - Signed by Nolia Baumgartner, MD on 06/17/2020   02/26/2013 Initial Diagnosis   Ductal carcinoma in situ of right breast     INTERVAL HISTORY:  Jennifer Waller presents to the Survivorship Clinic today for routine follow-up for her history of breast cancer.  Overall, she reports feeling fairly well. She reports new tremor being treated with Botox injections. She denies any changes in her family history.  Bilateral screening mammogram did not reveal any evidence of malignancy.  Bone density scan revealed osteoporosis.  She was admitted to [facility] in December  2024 with acute kidney injury due to severe gastroenteritis.  No GI organism was identified.  CT chest, abdomen and pelvis revealed gastric distention with dilated fluid-filled segments of small bowel.  There was moderate patchy posterior right basilar infiltrate and atelectasis with trace fluid within the right minor fissure.  She was felt to have aspiration pneumonia.  Dr. Britta Candy did not feel she had a small bowel obstruction.   She was treated conservatively and later discharged home.  She states she is scheduled to see Dr. Nadia Aurora next month.   REVIEW OF SYSTEMS:  Review of Systems  Constitutional:  Negative for appetite change, chills, diaphoresis, fatigue, fever and unexpected weight change.  HENT:   Negative for lump/mass, mouth sores, nosebleeds and sore throat.   Respiratory:  Positive for shortness of breath (occasional with asthma). Negative for cough and hemoptysis.   Cardiovascular:  Negative for chest pain and leg swelling.  Gastrointestinal:  Negative for abdominal pain, blood in stool, constipation, diarrhea, nausea and vomiting.  Endocrine: Negative for hot flashes.  Genitourinary:  Negative for difficulty urinating, dysuria, frequency, hematuria, vaginal bleeding and vaginal discharge.   Musculoskeletal:  Negative for arthralgias, back pain, gait problem, myalgias and neck pain.  Skin:  Negative for itching and rash.  Neurological:  Negative for dizziness, extremity weakness, gait problem, headaches, light-headedness and numbness.       Chronic tremor  Hematological:  Negative for adenopathy. Does not bruise/bleed easily.  Psychiatric/Behavioral:  Negative for depression and sleep disturbance. The patient is not nervous/anxious.      PAST MEDICAL/SURGICAL HISTORY:  Past Medical History:  Diagnosis Date   Allergy    Atypical chest pain 02/20/2023   Bruising 01/13/2017   Dependent edema 12/25/2020   Ductal carcinoma in situ of right breast 02/26/2013   Dupuytren's contracture of left hand 10/11/2019   Dyspnea on exertion 02/20/2023   Elevated glucose 05/13/2020   Environmental and seasonal allergies 10/11/2019   Essential hypertension 01/13/2017   Essential tremor 04/28/2021   GERD (gastroesophageal reflux disease)    History of lumbar surgery 11/09/2020   Hospital discharge follow-up 11/30/2020   Hypothyroidism 10/11/2022   Low magnesium level 12/25/2020   Lower respiratory infection  (e.g., bronchitis, pneumonia, pneumonitis, pulmonitis) 02/11/2022   Lumbar degenerative disc disease 12/19/2017   Malaise and fatigue 05/13/2020   Medicare annual wellness visit, subsequent 03/28/2019   Mixed dyslipidemia 05/13/2020   Near syncope 02/20/2023   Need for immunization against influenza 05/13/2020   Osteoporosis    Other spondylosis with radiculopathy, lumbar region 11/09/2020   Pain in both lower legs 05/13/2020   Spinal stenosis in cervical region 06/20/2018   Vitamin B12 deficiency 01/13/2017   Vitamin D deficiency 01/13/2017    Past Surgical History:  Procedure Laterality Date   ABDOMINAL HYSTERECTOMY  1976   due to fibroids   APPENDECTOMY  1963   BREAST BIOPSY     CARPAL TUNNEL RELEASE Right    CHOLECYSTECTOMY  1985   SALPINGOOPHORECTOMY Bilateral      ALLERGIES:  Allergies  Allergen Reactions   Doxycycline Nausea And Vomiting and Other (See Comments)   Ibandronate Nausea Only and Other (See Comments)     CURRENT MEDICATIONS:  Outpatient Encounter Medications as of 11/01/2023  Medication Sig   aspirin EC 81 MG tablet Take 81 mg by mouth daily. Swallow whole.   Cholecalciferol (VITAMIN D3) 125 MCG (5000 UT) CAPS Take 1 capsule by mouth daily.   Cyanocobalamin (VITAMIN B 12 PO) Take 1 tablet by  mouth daily.   gabapentin (NEURONTIN) 400 MG capsule Take 400 mg by mouth 3 (three) times daily. Taking once daily   albuterol (VENTOLIN HFA) 108 (90 Base) MCG/ACT inhaler Inhale 2 puffs into the lungs every 6 (six) hours as needed for wheezing or shortness of breath.   atenolol  (TENORMIN ) 25 MG tablet Take 0.5 tablets (12.5 mg total) by mouth 2 (two) times daily. (Patient taking differently: Take 25 mg by mouth daily.)   busPIRone (BUSPAR) 10 MG tablet Take 1 tablet by mouth 3 (three) times daily. Taking twice daily   calcium carbonate (OSCAL) 1500 (600 Ca) MG TABS tablet Take 600 mg of elemental calcium by mouth daily with breakfast. Increased to 2 tablets on  10/14/2022   Fluticasone Furoate-Vilanterol (BREO ELLIPTA IN) Inhale 1 puff into the lungs daily.   losartan-hydrochlorothiazide (HYZAAR) 50-12.5 MG tablet Take 1 tablet by mouth daily.   Magnesium Oxide 400 MG CAPS Take 1 capsule by mouth daily.   Multiple Vitamins-Minerals (CENTRUM SILVER PO) Take 1 tablet by mouth every evening.   omeprazole (PRILOSEC) 40 MG capsule Take 1 capsule by mouth daily.   pravastatin  (PRAVACHOL ) 40 MG tablet Take 1 tablet (40 mg total) by mouth every evening.   primidone (MYSOLINE) 50 MG tablet Take 50 mg by mouth 2 (two) times daily.   Probiotic Product (PROBIOTIC DAILY PO) Take 1 tablet by mouth daily.   [DISCONTINUED] amLODipine (NORVASC) 5 MG tablet Take 5 mg by mouth daily.   [DISCONTINUED] fluticasone (FLONASE) 50 MCG/ACT nasal spray Place 1 spray into both nostrils 2 (two) times daily as needed for allergies or rhinitis.   [DISCONTINUED] gabapentin (NEURONTIN) 300 MG capsule Take 300 mg by mouth at bedtime.   [DISCONTINUED] Omega-3 1000 MG CAPS Take 1 capsule by mouth daily.   No facility-administered encounter medications on file as of 11/01/2023.     ONCOLOGIC FAMILY HISTORY:  Family History  Problem Relation Age of Onset   Lung cancer Sister    Skin cancer Maternal Grandfather    Cancer Paternal Grandmother        neck     SOCIAL HISTORY:   reports that she quit smoking about 11 years ago. Her smoking use included cigarettes. She has never used smokeless tobacco. She reports that she does not drink alcohol and does not use drugs.   PHYSICAL EXAMINATION:  Vital Signs: Vitals:   11/01/23 1125  BP: 136/81  Pulse: 73  Resp: 20  Temp: 97.8 F (36.6 C)  SpO2: 96%   Filed Weights   11/01/23 1125  Weight: 163 lb 8 oz (74.2 kg)   Physical Exam Vitals and nursing note reviewed.  Constitutional:      General: She is not in acute distress.    Appearance: Normal appearance.  HENT:     Head: Normocephalic and atraumatic.     Mouth/Throat:      Mouth: Mucous membranes are moist.     Pharynx: Oropharynx is clear. No oropharyngeal exudate or posterior oropharyngeal erythema.  Eyes:     General: No scleral icterus.    Extraocular Movements: Extraocular movements intact.     Conjunctiva/sclera: Conjunctivae normal.     Pupils: Pupils are equal, round, and reactive to light.  Cardiovascular:     Rate and Rhythm: Normal rate and regular rhythm.     Heart sounds: Normal heart sounds. No murmur heard.    No friction rub. No gallop.  Pulmonary:     Effort: Pulmonary effort is normal.  Breath sounds: Normal breath sounds. Decreased air movement (throughout) present. No wheezing, rhonchi or rales.  Chest:  Breasts:    Right: Normal. No swelling, bleeding, inverted nipple, mass, nipple discharge, skin change or tenderness.     Left: Normal. No swelling, bleeding, inverted nipple, mass, nipple discharge, skin change or tenderness.  Abdominal:     General: There is no distension.     Palpations: Abdomen is soft. There is no hepatomegaly, splenomegaly or mass.     Tenderness: There is no abdominal tenderness.  Musculoskeletal:        General: Normal range of motion.     Cervical back: Normal range of motion and neck supple. No tenderness.     Right lower leg: No edema.     Left lower leg: No edema.  Lymphadenopathy:     Cervical: No cervical adenopathy.     Upper Body:     Right upper body: No supraclavicular or axillary adenopathy.     Left upper body: No supraclavicular or axillary adenopathy.     Lower Body: No right inguinal adenopathy. No left inguinal adenopathy.  Skin:    General: Skin is warm and dry.     Coloration: Skin is not jaundiced.     Findings: No rash.  Neurological:     Mental Status: She is alert and oriented to person, place, and time.     Cranial Nerves: No cranial nerve deficit.  Psychiatric:        Mood and Affect: Mood normal.        Behavior: Behavior normal.        Thought Content: Thought  content normal.      LABORATORY DATA:     Latest Ref Rng & Units 11/01/2023   10:27 AM 10/11/2022    9:54 AM 06/17/2020    1:17 PM  CBC  WBC 4.0 - 10.5 K/uL 7.8  8.1  15.5   Hemoglobin 12.0 - 15.0 g/dL 20.2  54.2  70.6   Hematocrit 36.0 - 46.0 % 45.8  45.3  48.6   Platelets 150 - 400 K/uL 258  283  375       Latest Ref Rng & Units 11/01/2023   10:27 AM 10/11/2022    9:54 AM 06/17/2020    1:17 PM  CMP  Glucose 70 - 99 mg/dL 237  628  96   BUN 8 - 23 mg/dL 13  13  20    Creatinine 0.44 - 1.00 mg/dL 3.15  1.76  1.60   Sodium 135 - 145 mmol/L 137  134  141   Potassium 3.5 - 5.1 mmol/L 3.7  3.7  4.3   Chloride 98 - 111 mmol/L 98  101  104   CO2 22 - 32 mmol/L 26  24  26    Calcium 8.9 - 10.3 mg/dL 9.7  8.5  8.6   Total Protein 6.5 - 8.1 g/dL 8.0  8.0  7.8   Total Bilirubin 0.0 - 1.2 mg/dL 0.5  0.6  0.6   Alkaline Phos 38 - 126 U/L 140  105  131   AST 15 - 41 U/L 54  53  45   ALT 0 - 44 U/L 37  38  53     DIAGNOSTIC IMAGING:    Exam(s): [id number] MAM/MAM DIGITAL TOMO SCREENING B  EXAM:  MAM DIGITAL TOMO SCREENING B 10/27/2023  CLINICAL HISTORY:  F, Age 38 y/o , Z12.31 personal history of breast cancer, post radiation, left  BREAST CANCER RISK  ASSESSMENT:  Not reported  TECHNIQUE:  Bilateral screening digital breast tomosynthesis with 2D images. Computer aided detection.  COMPARISON:  Prior exam(s) dated October 13, 2022, September 02, 2021, August 05, 2020.  FINDINGS:  TISSUE DENSITY: The breast tissue is heterogenously dense, which may obscure small masses.  Bilateral Breast Mammographic Findings: Scar in the left appears stable. Stable punctate and macrocalcifications are present in the right and left.  No new masses, calcifications or other abnormalities are identified.  IMPRESSION:  OVERALL FINAL ASSESSMENT: BIRADS 2 BENIGN FINDING  RECOMMENDATION:  Routine annual follow-up in 1 Year  A letter with findings and recommendations will be mailed to the patient.       Exam(s): [id number] DEXA/DG DEXA  EXAM:  DEXA  INDICATION:  Bone density screening  COMPARISON:  10/13/2021  TECHNIQUE:  Bone mineral density measurements of the lumbar spine, hips, and/or radius. For all anatomic locations, a Z-score measurement was obtained comparing the measured bone mineral density to age-matched normals. A T-score was obtained comparing the measured bone mineral density to standard normals for peak bone mineral density for the population. FRAX was calculated if not contraindicated.  FINDINGS:  Radius 33% Bone mineral density is 0.630 g/cm2 with a T-score of -2.8 and the Z-score -0.4. Decrease of % from prior exam.  Femoral Neck Bone mineral density is 0.741 g/cm2 with a T-score of -2.1 and a Z-score of -0.1.  Total Hip: Bone mineral density is 0.786 g/cm2 with a T-score of -1.8 and the Z-score of 0.1. Increase of 0.9 % as compared to the prior exam.  FRAX Score: Not calculated due to osteoporosis.  IMPRESSION:  Osteoporosis with high fracture risk.  RECOMMENDATION: Follow-up in 2 years, or as clinically warranted.  Notes:  World Health Organization criteria for BMD interpretation classify patients as:  Normal (T-score at or above -1.0),  Osteopenic (T-score between -1.0 and -2.5),or  Osteoporotic (T-score at or below -2.5).  The presence of vertebral abnormalities such as scoliosis or osteophytes, or aortic/ligamentous calcifications can alter readings of the lumbar spine. In such cases, readings of the hips are more reliable.     ASSESSMENT AND PLAN:  Ms.. Waller is a pleasant 77 y.o. female with history of stage 0 ductal carcinoma in situ of the right breast , diagnosed in July 2014; treated with lumpectomy and adjuvant radiation therapy. She took chemoprevention with raloxifene for 5 years, completed in .  1. History of stage 0 breast cancer:  Jennifer Waller is currently clinically and radiographically without evidence of disease or recurrence.  She will follow-up in  the Survivorship Clinic in 1 year with history and physical exam per surveillance protocol.  I encouraged her to call me with any questions or concerns before her next visit at the cancer center, and I would be happy to see the patient sooner, if needed.    2. Worsening bone density, now with osteoporosis. She did not tolerate alendronate, ibandronate or Prolia, so was treated with Reclast. She was then was on Tymlos daily prior to her spinal surgery.  She continues calcium and vitamin D supplement.  She states she Dr. Nadia Aurora again soon, so I will see if he has a recommendation.  3. Elevated liver tests: Mild elevation of AST and alkaline phosphatase.  4. Cancer screening:  Due to Jennifer Waller's history and age, she should receive screening for skin cancers and breast cancer. The patient was encouraged to follow-up with her PCP for appropriate cancer screenings.   5. Health maintenance and  wellness promotion: Jennifer Waller was encouraged to consume 5-7 servings of fruits and vegetables per day. The patient was also encouraged to engage in moderate to vigorous exercise for 30 minutes per day most days of the week. Jennifer Waller was instructed to limit her alcohol consumption and continue to abstain from tobacco use.    Disposition:  -Return to Cancer Center to see the APP in Long Term Survivorship in 1 year.   A total of 30 minutes of face-to-face time was spent with this patient with greater than 50% of that time in counseling and care-coordination.   Camyra Vaeth A. Veverly Grace, PA-C Physician Assistant [facility]   Note: PRIMARY CARE PROVIDER [name], MD [phone]

## 2023-11-04 ENCOUNTER — Encounter: Payer: Self-pay | Admitting: Hematology and Oncology

## 2023-11-08 ENCOUNTER — Encounter: Payer: Self-pay | Admitting: Oncology

## 2023-11-16 ENCOUNTER — Telehealth: Payer: Self-pay

## 2023-11-16 NOTE — Telephone Encounter (Signed)
 Patient notified and voiced understanding, referral faxed to Dr Gerilyn Pilgrim office.

## 2023-11-16 NOTE — Addendum Note (Signed)
 Addended by: Lianne Bushy on: 11/16/2023 04:27 PM   Modules accepted: Orders

## 2023-11-16 NOTE — Telephone Encounter (Signed)
-----   Message from Jennifer Waller sent at 11/16/2023  1:25 PM EDT ----- Please let her know she had colonoscopy in 2019 with Dr. Charm Barges and had a precancerous polyp removed. F/U in 5 years was recommended. If she is willing, I would have her see GI to consider another colonoscopy. She will need a referral to GI. Thanks

## 2023-11-28 LAB — LIPID PANEL
Chol/HDL Ratio: 3.9 ratio (ref 0.0–4.4)
Cholesterol, Total: 154 mg/dL (ref 100–199)
HDL: 39 mg/dL — ABNORMAL LOW (ref >39)
LDL Chol Calc (NIH): 87 mg/dL (ref 0–99)
Triglycerides: 159 mg/dL — ABNORMAL HIGH (ref 0–149)
VLDL Cholesterol Cal: 28 mg/dL (ref 5–40)

## 2023-11-28 LAB — ALT: ALT: 36 IU/L — ABNORMAL HIGH (ref 0–32)

## 2023-11-28 LAB — AST: AST: 43 IU/L — ABNORMAL HIGH (ref 0–40)

## 2023-11-29 ENCOUNTER — Telehealth: Payer: Self-pay | Admitting: Cardiology

## 2023-11-29 NOTE — Telephone Encounter (Signed)
 Left vm that results are not completed at this time.

## 2023-11-29 NOTE — Telephone Encounter (Signed)
 Patient called to follow up on lab test results.

## 2023-12-01 ENCOUNTER — Telehealth: Payer: Self-pay

## 2023-12-01 NOTE — Telephone Encounter (Signed)
 Unable to reach or LM ?

## 2023-12-01 NOTE — Telephone Encounter (Signed)
-----   Message from Ralene Burger sent at 11/30/2023  9:56 AM EDT ----- Cholesterol acceptable, liver function test minimally elevated but better than before continue present management

## 2023-12-06 ENCOUNTER — Telehealth: Payer: Self-pay

## 2023-12-06 NOTE — Telephone Encounter (Signed)
-----   Message from Ralene Burger sent at 11/30/2023  9:56 AM EDT ----- Cholesterol acceptable, liver function test minimally elevated but better than before continue present management

## 2023-12-06 NOTE — Telephone Encounter (Signed)
 Patient notified of results and verbalized understanding.

## 2024-04-17 NOTE — Progress Notes (Signed)
Patient is here today for labwork only.

## 2024-05-14 NOTE — Progress Notes (Signed)
 [facility] Botulinum Toxin Injection Procedure Note   Date of procedure: 05/14/2024  Primary Care Physician: Jennifer DELENA Simpers, MD Referring Provider: No ref. 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 This is limiting patient's range of neck movement significantly affecting patient's activities of daily living. Patient's spasmodic neck pain is 10 out of 10 in intensity. Patient has been tried on primidone, gabapentin, atenolol , topiramate but did not find much improvement (or had side effects). The risk of side effects from medications is more than possible benefit.  Therefore a trial of botulinum toxin injection is required.    Medication Information: Toxin: Botox  Dilution: 1:1   Administrations This Visit     onabotulinumtoxin type A (BOTOX) injection 300 Units     Admin Date 05/14/2024 Action Given Dose 260 Units Route intramuscular Documented By Jennifer Waller, CMA              Interval History: Date of last injection: 02/06/2024  On patient global impression scale, patient reports they feel 1 = very much improved after last injection.  Patient had a good response from last injection. There were no side effects. The effect started wearing off approximately in 3 months. Her tremors breakthrough when distracted while talking or if anxious/nervous/emotional.  Head shakes in no-no fashion usually but can also be multidirectional. Pulling to the left usually.  The patient denies any problems with swallowing,  or shortness of breath. No weakness. Continues to feel off balance, listing to left     Allergies: Allergies  Allergen Reactions  . Doxycycline GI Intolerance,  Nausea And Vomiting and Other (See Comments)  . Ibandronate GI Intolerance     Procedure Details  The risks, benefits, indications, potential complications, and alternatives were explained to the patient.  Informed Consent signed in chart.  Time out was called with the team prior to procedure.   The site(s) for injection were identified. After prepping the skin with alcohol overlying the following muscles, botulinum toxin was injected intramuscularly using EMG guidance as follows. EMG guidance was necessary to localize targeted muscles with accuracy.   LEFT RIGHT  Muscle Units EMG Units EMG  Sternocleidomastoid 20 +++ 25 +++  Splenius Capitus 45 +++ 25 +++  Levator Scapulae 30 +++ 25 +++  Trapezius 30+20 +++ 20+20 +++  Longissimus      Semispinalis      Scalenes      Other            Units Injected  140  115     Total units injected: 260 Total units wasted: 40 (observed by staff)  Injection Notes:   Plan: Similar strategy to last given good benefit. We can increase doses further if tolerated or needed   Return to clinic for assessment of botulinum toxin effectiveness and repeat botulinum injection in 12 weeks.    Jennifer Jeans, MD, MS Clinical Assistant Professor of Neurology

## 2024-05-23 ENCOUNTER — Encounter: Payer: Self-pay | Admitting: Oncology

## 2024-05-23 ENCOUNTER — Other Ambulatory Visit: Payer: Self-pay | Admitting: Oncology

## 2024-05-23 ENCOUNTER — Inpatient Hospital Stay: Attending: Oncology | Admitting: Oncology

## 2024-05-23 ENCOUNTER — Inpatient Hospital Stay

## 2024-05-23 VITALS — BP 140/59 | HR 81 | Temp 98.0°F | Resp 18 | Ht 62.0 in | Wt 162.2 lb

## 2024-05-23 DIAGNOSIS — Z86000 Personal history of in-situ neoplasm of breast: Secondary | ICD-10-CM | POA: Insufficient documentation

## 2024-05-23 DIAGNOSIS — N61 Mastitis without abscess: Secondary | ICD-10-CM | POA: Diagnosis not present

## 2024-05-23 DIAGNOSIS — D0511 Intraductal carcinoma in situ of right breast: Secondary | ICD-10-CM | POA: Diagnosis not present

## 2024-05-23 DIAGNOSIS — Z923 Personal history of irradiation: Secondary | ICD-10-CM | POA: Diagnosis not present

## 2024-05-23 DIAGNOSIS — M810 Age-related osteoporosis without current pathological fracture: Secondary | ICD-10-CM | POA: Insufficient documentation

## 2024-05-23 DIAGNOSIS — M5030 Other cervical disc degeneration, unspecified cervical region: Secondary | ICD-10-CM | POA: Diagnosis not present

## 2024-05-23 MED ORDER — CEPHALEXIN 500 MG PO CAPS: 500.0000 mg | ORAL_CAPSULE | Freq: Three times a day (TID) | ORAL | 0 refills | Status: DC

## 2024-05-23 NOTE — Progress Notes (Signed)
 [facility]  [address] [place],  [place]  [address] [phone]  Clinic Day:  05/23/24  Referring physician: Silver Lamar LABOR, MD    CHIEF COMPLAINT:  CC: History of stage 0 ductal carcinoma in situ  Current Treatment:  Surveillance   HISTORY OF PRESENT ILLNESS:  Jennifer Waller is a 78 y.o. 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  She was treated with lumpectomy.  Pathology reveals a 2 cm, low to intermediate grade, ductal carcinoma in situ with a negative intramammary node.  Estrogen and progesterone receptors were negative.  She was placed on the NSABP clinical trial B43, but HER 2 Neu testing was negative.  She received adjuvant radiation to the left breast, completed in October 2014.  She was placed on raloxifene for chemoprevention in November 2014.  She has osteoporosis, but did not tolerate alendronate, ibandronate or denosumab.  Bone density scan in September 2018 revealed worsening bone density with a T-score -2.9 in the spine and a T-score of -2.9 in femur, which was decreased from previous by 5.4% and 4.3% respectively.  We therefore recommended she try yearly Reclast for the osteoporosis, which she started last year.  She completed her 5 years of Raloxifene in 2019.  She had her annual mammogram at the end of October 2020 which was clear, and she has followed up with Dr. Bert, but requests me to do her follow up going forward.  She also had a bone density scan on October 30th which revealed a T-score of -3.5 of the AP spine L1-L3, previously -3.2, and a T score of -2.8 of the femur neck (left), previously -2.9.  Both of these scores are considered osteoporotic.  She continues Os-Cal Ultra twice daily.  She was supposed to undergo surgery for degenerative disc disease back in August, but she contracted [facility] Spotted Fever.  This has been rescheduled for January with Dr. Marlyce banker  this had to be canceled due to her severe osteoporosis.  The Reclast was stopped and she was placed Tymlos injections daily for the last year.    INTERVAL HISTORY:  Jennifer Waller is here today for evaluation of left breast redness and warmth which she noticed on 05/20/2024 after showering. She denies tenderness or pain of the breast. After examination, this is consistent with cellulitis likely caused by an insect bite so I will prescribe 500 mg Keflex TID for 10 days. I have instructed her to call back and come in if it does not resolve. I will see her back in March with her annual mammogram, lung cancer screening CT chest, CBC, and CMP. She denies fever, chills, night sweats, or other signs of infection. She denies cardiorespiratory and gastrointestinal issues. She  denies pain. Her appetite is good and Her weight has decreased 1 pound over the last 7 months.  REVIEW OF SYSTEMS:  Review of Systems  Constitutional: Negative.   HENT:  Negative.    Eyes: Negative.   Endocrine: Negative.   Genitourinary: Negative.    Musculoskeletal:  Positive for back pain.  Skin:        Cellulitis of the left breast with erythema and warmth  Neurological: Negative.   Hematological: Negative.   Psychiatric/Behavioral: Negative.    All other systems reviewed and are negative.    VITALS:  Blood pressure (!) 140/59, pulse 81, temperature 98 F (36.7 C), temperature source Oral, resp. rate 18, height 5'  2 (1.575 m), weight 162 lb 3.2 oz (73.6 kg), SpO2 98%.  Wt Readings from Last 3 Encounters:  05/23/24 162 lb 3.2 oz (73.6 kg)  11/01/23 163 lb 8 oz (74.2 kg)  08/14/23 164 lb 6.4 oz (74.6 kg)    Body mass index is 29.67 kg/m.  Performance status (ECOG): 1 - Symptomatic but completely ambulatory  PHYSICAL EXAM:  Physical Exam Constitutional:      General: She is not in acute distress.    Appearance: Normal appearance. She is normal weight.  HENT:     Head: Normocephalic and atraumatic.  Eyes:     General:  No scleral icterus.    Extraocular Movements: Extraocular movements intact.     Conjunctiva/sclera: Conjunctivae normal.     Pupils: Pupils are equal, round, and reactive to light.  Cardiovascular:     Rate and Rhythm: Normal rate and regular rhythm.     Pulses: Normal pulses.     Heart sounds: Normal heart sounds. No murmur heard.    No friction rub. No gallop.  Pulmonary:     Effort: Pulmonary effort is normal. No respiratory distress.     Breath sounds: Normal breath sounds.  Chest:  Breasts:    Right: Normal. No mass.     Left: Skin change present. No mass or tenderness.     Comments: Erythema encompassing most of the central left breast which is warm to touch, but not tender Well healed scar in the lateral left breast No masses in either breast  Abdominal:     General: Bowel sounds are normal. There is no distension.     Palpations: Abdomen is soft. There is no mass.     Tenderness: There is no abdominal tenderness.  Musculoskeletal:        General: Normal range of motion.     Cervical back: Normal range of motion and neck supple.     Right lower leg: No edema.     Left lower leg: No edema.  Lymphadenopathy:     Cervical: No cervical adenopathy.  Skin:    General: Skin is warm and dry.  Neurological:     General: No focal deficit present.     Mental Status: She is alert and oriented to person, place, and time. Mental status is at baseline.  Psychiatric:        Mood and Affect: Mood normal.        Behavior: Behavior normal.        Thought Content: Thought content normal.        Judgment: Judgment normal.     LABS:      Latest Ref Rng & Units 11/01/2023   10:27 AM 10/11/2022    9:54 AM 06/17/2020    1:17 PM  CBC  WBC 4.0 - 10.5 K/uL 7.8  8.1  15.5   Hemoglobin 12.0 - 15.0 g/dL 84.9  85.3  84.8   Hematocrit 36.0 - 46.0 % 45.8  45.3  48.6   Platelets 150 - 400 K/uL 258  283  375       Latest Ref Rng & Units 11/27/2023    8:28 AM 11/01/2023   10:27 AM 10/11/2022     9:54 AM  CMP  Glucose 70 - 99 mg/dL  899  882   BUN 8 - 23 mg/dL  13  13   Creatinine 9.55 - 1.00 mg/dL  9.31  9.31   Sodium 864 - 145 mmol/L  137  134   Potassium  3.5 - 5.1 mmol/L  3.7  3.7   Chloride 98 - 111 mmol/L  98  101   CO2 22 - 32 mmol/L  26  24   Calcium 8.9 - 10.3 mg/dL  9.7  8.5   Total Protein 6.5 - 8.1 g/dL  8.0  8.0   Total Bilirubin 0.0 - 1.2 mg/dL  0.5  0.6   Alkaline Phos 38 - 126 U/L  140  105   AST 0 - 40 IU/L 43  54  53   ALT 0 - 32 IU/L 36  37  38      STUDIES:  No results found.   Allergies:  Allergies  Allergen Reactions   Doxycycline Nausea And Vomiting and Other (See Comments)   Ibandronate Nausea Only and Other (See Comments)    Current Medications: Current Outpatient Medications  Medication Sig Dispense Refill   albuterol (VENTOLIN HFA) 108 (90 Base) MCG/ACT inhaler Inhale 2 puffs into the lungs every 6 (six) hours as needed for wheezing or shortness of breath.     aspirin EC 81 MG tablet Take 81 mg by mouth daily. Swallow whole.     busPIRone (BUSPAR) 10 MG tablet Take 1 tablet by mouth 3 (three) times daily. Taking twice daily     Cholecalciferol (VITAMIN D3) 125 MCG (5000 UT) CAPS Take 1 capsule by mouth daily.     Fluticasone Furoate-Vilanterol (BREO ELLIPTA IN) Inhale 1 puff into the lungs daily.     gabapentin (NEURONTIN) 400 MG capsule Take 400 mg by mouth 3 (three) times daily. Taking once daily     losartan-hydrochlorothiazide (HYZAAR) 50-12.5 MG tablet Take 1 tablet by mouth daily.     Multiple Vitamins-Minerals (CENTRUM SILVER PO) Take 1 tablet by mouth every evening.     NON FORMULARY Painaway extra strength every day     omeprazole (PRILOSEC) 40 MG capsule Take 1 capsule by mouth daily.     pravastatin  (PRAVACHOL ) 40 MG tablet Take 1 tablet (40 mg total) by mouth every evening. 90 tablet 3   primidone (MYSOLINE) 50 MG tablet Take 50 mg by mouth 2 (two) times daily.     Probiotic Product (PROBIOTIC DAILY PO) Take 1 tablet by  mouth daily.     terbinafine (LAMISIL) 250 MG tablet Take 250 mg by mouth daily.     atenolol  (TENORMIN ) 25 MG tablet Take 0.5 tablets (12.5 mg total) by mouth 2 (two) times daily. (Patient taking differently: Take 25 mg by mouth daily.) 90 tablet 3   calcium carbonate (OSCAL) 1500 (600 Ca) MG TABS tablet Take 600 mg of elemental calcium by mouth daily with breakfast. Increased to 2 tablets on 10/14/2022     cephALEXin (KEFLEX) 500 MG capsule Take 1 capsule (500 mg total) by mouth 3 (three) times daily for 10 days. 12 capsule 0   Cyanocobalamin (VITAMIN B 12 PO) Take 1 tablet by mouth daily.     Magnesium Oxide 400 MG CAPS Take 1 capsule by mouth daily.     No current facility-administered medications for this visit.     ASSESSMENT & PLAN:   Assessment:   1. 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  She remains without evidence of recurrence.  She completed 5 years of chemoprevention with raloxifene in November 2019.  2. Osteoporosis.  She was placed on Tymlos injections daily back in February 2021 by Dr. Marlyce.    3. 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    4. Acute left breast mastitis. This may have been from an insect bite, but we are not sure. I will treat this with Keflex and have instructed her to call us  next week if it is not resolving.  Plan: Jennifer Waller is here today for evaluation of left breast redness and warmth which she noticed on 05/20/2024 after showering. She denies tenderness or pain of the breast. After examination, this is consistent with cellulitis likely caused by an insect bite so I will prescribe 500 mg Keflex TID for 10 days. I have instructed her to call back and come in if it does not resolve. I will see her back in March with her annual mammogram, lung cancer screening CT chest, CBC, and CMP.   I provided 12 minutes of face-to-face time during this this encounter and > 50% was spent  counseling as documented under my assessment and plan.    Wanda VEAR Cornish, MD [facility] AT [facility] [address] [place] [address] Dept: [phone] Dept Fax: [phone]   I,Jahid Weida H Brannen Koppen,acting as a scribe for Wanda VEAR Cornish, MD.,have documented all relevant documentation on the behalf of Wanda VEAR Cornish, MD,as directed by  Wanda VEAR Cornish, MD while in the presence of Wanda VEAR Cornish, MD.  I have reviewed this report as typed by the medical scribe, and it is complete and accurate.

## 2024-06-03 ENCOUNTER — Other Ambulatory Visit: Payer: Self-pay | Admitting: Hematology and Oncology

## 2024-06-03 ENCOUNTER — Telehealth: Payer: Self-pay

## 2024-06-03 DIAGNOSIS — N61 Mastitis without abscess: Secondary | ICD-10-CM

## 2024-06-03 MED ORDER — CEPHALEXIN 500 MG PO CAPS: 500.0000 mg | ORAL_CAPSULE | Freq: Three times a day (TID) | ORAL | 0 refills | Status: AC

## 2024-06-03 NOTE — Telephone Encounter (Signed)
 Pt came in to see Dr Cornelius last week. She states she completed the antibiotic Dr Cornelius gave her. The breast is still a little red, but better. I was wondering if she would want to give me a little more antibiotic?

## 2024-10-31 ENCOUNTER — Other Ambulatory Visit

## 2024-10-31 ENCOUNTER — Encounter: Admitting: Hematology and Oncology

## 2024-10-31 ENCOUNTER — Ambulatory Visit (HOSPITAL_BASED_OUTPATIENT_CLINIC_OR_DEPARTMENT_OTHER): Admitting: Radiology

## 2024-11-05 ENCOUNTER — Other Ambulatory Visit

## 2024-11-05 ENCOUNTER — Encounter: Admitting: Hematology and Oncology
